# Patient Record
Sex: Male | Born: 1980 | Race: White | Hispanic: No | Marital: Married | Smoking: Former smoker
Health system: Southern US, Community
[De-identification: ages and names within clinical notes are randomized; demographics above are authoritative.]

## PROBLEM LIST (undated history)

## (undated) DIAGNOSIS — F988 Other specified behavioral and emotional disorders with onset usually occurring in childhood and adolescence: Secondary | ICD-10-CM

## (undated) DIAGNOSIS — K219 Gastro-esophageal reflux disease without esophagitis: Secondary | ICD-10-CM

## (undated) HISTORY — DX: Other specified behavioral and emotional disorders with onset usually occurring in childhood and adolescence: F98.8

## (undated) HISTORY — DX: Gastro-esophageal reflux disease without esophagitis: K21.9

---

## 2014-09-21 DIAGNOSIS — R41840 Attention and concentration deficit: Secondary | ICD-10-CM

## 2015-01-29 ENCOUNTER — Encounter: Payer: Self-pay | Admitting: Adult Health

## 2015-01-29 ENCOUNTER — Ambulatory Visit (INDEPENDENT_AMBULATORY_CARE_PROVIDER_SITE_OTHER): Payer: BC Managed Care – PPO | Admitting: Adult Health

## 2015-01-29 VITALS — BP 110/80 | HR 71 | Temp 98.6°F | Ht 69.0 in | Wt 190.1 lb

## 2015-01-29 DIAGNOSIS — Z23 Encounter for immunization: Secondary | ICD-10-CM | POA: Diagnosis not present

## 2015-01-29 DIAGNOSIS — Z7689 Persons encountering health services in other specified circumstances: Secondary | ICD-10-CM

## 2015-01-29 DIAGNOSIS — Z7189 Other specified counseling: Secondary | ICD-10-CM

## 2015-01-29 DIAGNOSIS — J0110 Acute frontal sinusitis, unspecified: Secondary | ICD-10-CM | POA: Diagnosis not present

## 2015-01-29 MED ORDER — DOXYCYCLINE HYCLATE 100 MG PO CAPS
100.0000 mg | ORAL_CAPSULE | Freq: Two times a day (BID) | ORAL | Status: DC
Start: 2015-01-29 — End: 2015-03-01

## 2015-01-29 NOTE — Patient Instructions (Signed)
It was great meeting you today. Please follow up with me in one month for a complete physical - do not eat anything after midnight.   The antibiotics will be sent to the pharmacy.   Let me know if you need anything in the mean time.

## 2015-01-29 NOTE — Progress Notes (Signed)
HPI:  Riley Cardenas is here to establish care. He is a very pleasant, young caucasian male. He does not smoke, is married and has a 81 month old daughter.  Last PCP and physical: 10- years ago.   Immunizations:UTD Diet:Likes sweets, does not eat a lot of vegetables. "I eat healthy 50 % of the time).  Exercise:Does not exercise as much as he should. Occ. Run.  Eye Doctor: yearly  Dentist: Will make appointment.  Has the following chronic problems that require follow up and concerns today:   He has had cough, congestion and sinus pain and pressure with associated fever for about three weeks. Fever was three weeks ago.  This week he endorses having a sore throat. Today he feels better, throat is not as sore, very little sinus pressure.   He has been using netty pot before bed- which he endorses helps with his sinus pressure.     ROS negative for unless reported above:unintentional weight loss, hearing or vision loss, chest pain, palpitations, leg claudication, struggling to breath,Not feeling congested in the chest, no orthopenia, no cough,no wheezing, normal appetite, no soft tissue swelling, no hemoptysis, melena, hematochezia, hematuria, falls, loc, si, or thoughts of self harm.    Past Medical History  Diagnosis Date  . GERD (gastroesophageal reflux disease)     History reviewed. No pertinent past surgical history.  Family History  Problem Relation Age of Onset  . Arthritis Maternal Grandmother   . Hypertension Maternal Grandfather   . Arthritis Maternal Grandfather     History   Social History  . Marital Status: Married    Spouse Name: N/A  . Number of Children: N/A  . Years of Education: N/A   Social History Main Topics  . Smoking status: Former Research scientist (life sciences)  . Smokeless tobacco: Not on file  . Alcohol Use: 0.0 oz/week    0 Standard drinks or equivalent per week     Comment: occ  . Drug Use: No  . Sexual Activity: Not on file   Other Topics Concern  . None    Social History Narrative  . None    No current outpatient prescriptions on file.  EXAM:  Filed Vitals:   01/29/15 0840  BP: 110/80  Pulse: 71  Temp: 98.6 F (37 C)    Body mass index is 28.06 kg/(m^2).  GENERAL: vitals reviewed and listed above, alert, oriented, appears well hydrated and in no acute distress  HEENT: atraumatic, conjunttiva clear, no obvious abnormalities on inspection of external nose and ears  NECK: Neck is soft and supple without masses, no adenopathy or thyromegaly, trachea midline, no JVD. Normal range of motion.   LUNGS: clear to auscultation bilaterally, no wheezes, rales or rhonchi, good air movement  CV: Regular rate and rhythm, normal S1/S2, no audible murmurs, gallops, or rubs. No carotid bruit and no peripheral edema.   MS: moves all extremities without noticeable abnormality. No edema noted  Abd: soft/nontender/nondistended/normal bowel sounds   Skin: warm and dry, no rash   Extremities: No clubbing, cyanosis, or edema. Capillary refill is WNL. Pulses intact bilaterally in upper and lower extremities.   Neuro: CN II-XII intact, sensation and reflexes normal throughout, 5/5 muscle strength in bilateral upper and lower extremities. Normal finger to nose. Normal rapid alternating movements. Normal romberg. No pronator drift.   PSYCH: pleasant and cooperative, no obvious depression or anxiety  ASSESSMENT AND PLAN: 1. Acute frontal sinusitis, recurrence not specified - doxycycline (VIBRAMYCIN) 100 MG capsule; Take 1 capsule (  100 mg total) by mouth 2 (two) times daily.  Dispense: 14 capsule; Refill: 0 - will take if no improvement in 3 days.    2. Encounter to establish care - Follow up in one month for CPE - Follow up sooner if needed   Discussed the following assessment and plan:  No diagnosis found. -We reviewed the PMH, PSH, FH, SH, Meds and Allergies. -We provided refills for any medications we will prescribe as needed. -We  addressed current concerns per orders and patient instructions. -We have asked for records for pertinent exams, studies, vaccines and notes from previous providers. -We have advised patient to follow up per instructions below.   -Patient advised to return or notify a provider immediately if symptoms worsen or persist or new concerns arise.  There are no Patient Instructions on file for this visit.   [employer]

## 2015-02-11 ENCOUNTER — Telehealth: Payer: Self-pay | Admitting: Adult Health

## 2015-02-11 ENCOUNTER — Other Ambulatory Visit: Payer: Self-pay | Admitting: Adult Health

## 2015-02-11 MED ORDER — AMOXICILLIN-POT CLAVULANATE 875-125 MG PO TABS: 1.0000 | ORAL_TABLET | Freq: Two times a day (BID) | ORAL | Status: DC

## 2015-02-11 NOTE — Telephone Encounter (Signed)
Augmentin sent in. Take twice a day for 7 days. If he continues to have sinus infection he needs to come in.

## 2015-02-11 NOTE — Telephone Encounter (Signed)
Pls advise.  

## 2015-02-11 NOTE — Telephone Encounter (Signed)
Called and spoke with pt and pt is aware. [place] there.  Pt will come back next week if needed.

## 2015-02-11 NOTE — Telephone Encounter (Signed)
Patient Name: Riley Cardenas  DOB: 10-10-1980    Initial Comment Caller states he was seen 6/15, rx 7 days of abx for sinus infection. Sx coming back, and [place] the beach.   Nurse Assessment  Nurse: Raphael Gibney, RN, Vanita Ingles Date/Time (Eastern Time): 02/11/2015 11:33:04 AM  Confirm and document reason for call. If symptomatic, describe symptoms. ---Caller states he was seen on 6/17 and he was prescribed antibiotics for sinus infection for 7 days which he has completed. Sinus infection got better. he was prescribed doxycycline. He is congested and coughing at night. Has yellow nasal congestion. No fever. He has sore throat from drainage. He is out of town at [employer].  Has the patient traveled out of the country within the last 30 days? ---No  Does the patient require triage? ---Yes  Related visit to physician within the last 2 weeks? ---Yes  Does the PT have any chronic conditions? (i.e. diabetes, asthma, etc.) ---No     Guidelines    Guideline Title Affirmed Question Affirmed Notes  Sinus Pain or Congestion Lots of coughing    Final Disposition User   See PCP When Office is Open (within 3 days) Raphael Gibney, Therapist, sports, [place]    Comments  Pt is out of town at [employer] and does not want to go to urgent care. He would like antibiotic called in.  Sees Dr. Dorothyann Peng

## 2015-02-11 NOTE — Telephone Encounter (Signed)
Attempted to call pt; vm not set up.  Will call at a later time.

## 2015-03-01 ENCOUNTER — Encounter: Payer: Self-pay | Admitting: Adult Health

## 2015-03-01 ENCOUNTER — Ambulatory Visit (INDEPENDENT_AMBULATORY_CARE_PROVIDER_SITE_OTHER): Payer: BC Managed Care – PPO | Admitting: Adult Health

## 2015-03-01 VITALS — BP 116/76 | Temp 98.0°F | Ht 69.0 in | Wt 187.4 lb

## 2015-03-01 DIAGNOSIS — Z0000 Encounter for general adult medical examination without abnormal findings: Secondary | ICD-10-CM | POA: Diagnosis not present

## 2015-03-01 LAB — LIPID PANEL
Cholesterol: 157 mg/dL (ref 0–200)
HDL: 39.5 mg/dL (ref >39.00)
LDL Cholesterol: 102 mg/dL — ABNORMAL HIGH (ref 0–99)
NONHDL: 117.5
TRIGLYCERIDES: 77 mg/dL (ref 0.0–149.0)
Total CHOL/HDL Ratio: 4
VLDL: 15.4 mg/dL (ref 0.0–40.0)

## 2015-03-01 LAB — CBC WITH DIFFERENTIAL/PLATELET
BASOS ABS: 0 10*3/uL (ref 0.0–0.1)
BASOS PCT: 0.6 % (ref 0.0–3.0)
EOS PCT: 3.4 % (ref 0.0–5.0)
Eosinophils Absolute: 0.2 10*3/uL (ref 0.0–0.7)
HCT: 45.3 % (ref 39.0–52.0)
HEMOGLOBIN: 15.5 g/dL (ref 13.0–17.0)
LYMPHS ABS: 2.2 10*3/uL (ref 0.7–4.0)
Lymphocytes Relative: 31.5 % (ref 12.0–46.0)
MCHC: 34.2 g/dL (ref 30.0–36.0)
MCV: 89.5 fl (ref 78.0–100.0)
Monocytes Absolute: 0.3 10*3/uL (ref 0.1–1.0)
Monocytes Relative: 4.9 % (ref 3.0–12.0)
Neutro Abs: 4.2 10*3/uL (ref 1.4–7.7)
Neutrophils Relative %: 59.6 % (ref 43.0–77.0)
PLATELETS: 200 10*3/uL (ref 150.0–400.0)
RBC: 5.06 Mil/uL (ref 4.22–5.81)
RDW: 14.2 % (ref 11.5–15.5)
WBC: 7 10*3/uL (ref 4.0–10.5)

## 2015-03-01 LAB — POCT URINALYSIS DIPSTICK
BILIRUBIN UA: NEGATIVE
Blood, UA: NEGATIVE
Glucose, UA: NEGATIVE
KETONES UA: NEGATIVE
Leukocytes, UA: NEGATIVE
Nitrite, UA: NEGATIVE
PH UA: 5.5
Protein, UA: NEGATIVE
SPEC GRAV UA: 1.01
Urobilinogen, UA: 0.2

## 2015-03-01 LAB — HEPATIC FUNCTION PANEL
ALT: 17 U/L (ref 0–53)
AST: 18 U/L (ref 0–37)
Albumin: 4.4 g/dL (ref 3.5–5.2)
Alkaline Phosphatase: 58 U/L (ref 39–117)
BILIRUBIN DIRECT: 0.2 mg/dL (ref 0.0–0.3)
TOTAL PROTEIN: 7.2 g/dL (ref 6.0–8.3)
Total Bilirubin: 0.9 mg/dL (ref 0.2–1.2)

## 2015-03-01 LAB — HEMOGLOBIN A1C: HEMOGLOBIN A1C: 5 % (ref 4.6–6.5)

## 2015-03-01 LAB — BASIC METABOLIC PANEL
BUN: 13 mg/dL (ref 6–23)
CO2: 28 meq/L (ref 19–32)
CREATININE: 0.85 mg/dL (ref 0.40–1.50)
Calcium: 9.3 mg/dL (ref 8.4–10.5)
Chloride: 105 mEq/L (ref 96–112)
GFR: 109.01 mL/min (ref >60.00)
GLUCOSE: 83 mg/dL (ref 70–99)
Potassium: 3.9 mEq/L (ref 3.5–5.1)
SODIUM: 138 meq/L (ref 135–145)

## 2015-03-01 LAB — TSH: TSH: 0.95 u[IU]/mL (ref 0.35–4.50)

## 2015-03-01 NOTE — Progress Notes (Signed)
Pre visit review using our clinic review tool, if applicable. No additional management support is needed unless otherwise documented below in the visit note. 

## 2015-03-01 NOTE — Patient Instructions (Signed)
Great seeing you again.   Continue to stay young and healthy! The holidays are over... 100% healthy eating and exercise.   I will follow up with you regarding your blood work.   Come back sooner than 10 years for a physical, preferably next year.   If you need anything in the meantime, please let me know.

## 2015-03-01 NOTE — Progress Notes (Signed)
Subjective:    Patient ID: Riley Cardenas, male    DOB: 17-Jan-1981, 34 y.o.   MRN: [id number]  HPI89 year old healthy male who presents to the office today for his CPE.  has a past medical history of GERD (gastroesophageal reflux disease) and ADD (attention deficit disorder). Has not had a physical in "10 years". Has no recent hospitalizations. Married with one new born boy.   He has no complaints today. Denies fevers, chills, congestion, CP, SOB, muscle aches and pains.   He is exercising and continue to work on eating a healthy diet.    Review of Systems  Constitutional: Negative.   HENT: Negative.   Eyes: Negative.   Respiratory: Negative.   Cardiovascular: Negative.   Gastrointestinal: Negative.   Endocrine: Negative.   Genitourinary: Negative.   Musculoskeletal: Negative.   Skin: Negative.   Allergic/Immunologic: Negative.   Neurological: Negative.   Hematological: Negative.   Psychiatric/Behavioral: Negative.   All other systems reviewed and are negative.  Past Medical History  Diagnosis Date  . GERD (gastroesophageal reflux disease)   . ADD (attention deficit disorder)     History   Social History  . Marital Status: Married    Spouse Name: N/A  . Number of Children: N/A  . Years of Education: N/A   Occupational History  . Not on file.   Social History Main Topics  . Smoking status: Former Research scientist (life sciences)  . Smokeless tobacco: Not on file  . Alcohol Use: 0.0 oz/week    0 Standard drinks or equivalent per week     Comment: occ  . Drug Use: No  . Sexual Activity: Not on file   Other Topics Concern  . 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  Family History  Problem Relation Age of Onset  . Arthritis Maternal Grandmother   . Hypertension Maternal Grandfather   . Arthritis Maternal Grandfather     No Known Allergies  No current  outpatient prescriptions on file prior to visit.   No current facility-administered medications on file prior to visit.    BP 116/76 mmHg  Temp(Src) 98 F (36.7 C) (Oral)  Ht 5\' 9"  (1.753 m)  Wt 187 lb 6.4 oz (85.004 kg)  BMI 27.66 kg/m2       Objective:   Physical Exam  Constitutional: He is oriented to person, place, and time. He appears well-developed and well-nourished. No distress.  HENT:  Head: Normocephalic and atraumatic.  Right Ear: External ear normal.  Left Ear: External ear normal.  Nose: Nose normal.  Mouth/Throat: Oropharynx is clear and moist. No oropharyngeal exudate.  Eyes: Conjunctivae and EOM are normal. Pupils are equal, round, and reactive to light. Right eye exhibits no discharge. Left eye exhibits no discharge.  Wearing glasses  Neck: Normal range of motion. Neck supple. No JVD present. No tracheal deviation present.  Cardiovascular: Normal rate, regular rhythm, normal heart sounds and intact distal pulses.  Exam reveals no gallop and no friction rub.   No murmur heard. Pulmonary/Chest: Effort normal and breath sounds normal. No respiratory distress. He has no wheezes. He has no rales. He exhibits no tenderness.  Abdominal: Soft. Bowel sounds are normal. He exhibits no distension and no mass. There is no tenderness. There is no rebound and no guarding.  Genitourinary:  Deferred  Musculoskeletal: Normal range of motion. He exhibits no edema or tenderness.  Lymphadenopathy:    He has no cervical adenopathy.  Neurological: He is alert and oriented to person, place, and time. He has normal reflexes.  Skin: Skin is warm and dry. No rash noted. No erythema. No pallor.  Scattered moles. Nothing concerning  Psychiatric: He has a normal mood and affect. His behavior is normal. Judgment and thought content normal.  Nursing note and vitals reviewed.      Assessment & Plan:  1. Routine general medical examination at a health care facility - Basic metabolic  panel - CBC with Differential/Platelet - Hemoglobin A1c - Hepatic function panel - Lipid panel - POCT urinalysis dipstick - TSH - HIV antibody -Follow up in one year for CPE - Follow up sooner if needed.  - Continue to exercise and eat a heart healthy diet.

## 2015-03-02 LAB — HIV ANTIBODY (ROUTINE TESTING W REFLEX): HIV: NONREACTIVE

## 2015-11-30 ENCOUNTER — Telehealth: Payer: BC Managed Care – PPO | Admitting: Nurse Practitioner

## 2015-11-30 DIAGNOSIS — J0101 Acute recurrent maxillary sinusitis: Secondary | ICD-10-CM

## 2015-11-30 MED ORDER — AZITHROMYCIN 250 MG PO TABS: ORAL_TABLET | ORAL | Status: DC

## 2015-11-30 NOTE — Progress Notes (Signed)

## 2016-03-26 ENCOUNTER — Ambulatory Visit (INDEPENDENT_AMBULATORY_CARE_PROVIDER_SITE_OTHER): Payer: BC Managed Care – PPO | Admitting: Adult Health

## 2016-03-26 ENCOUNTER — Encounter: Payer: Self-pay | Admitting: Adult Health

## 2016-03-26 VITALS — BP 120/78 | HR 82 | Temp 97.6°F | Ht 69.0 in | Wt 182.8 lb

## 2016-03-26 DIAGNOSIS — J069 Acute upper respiratory infection, unspecified: Secondary | ICD-10-CM | POA: Diagnosis not present

## 2016-03-26 MED ORDER — METHYLPREDNISOLONE 4 MG PO TBPK: ORAL_TABLET | ORAL | 0 refills | Status: DC

## 2016-03-26 NOTE — Progress Notes (Signed)
Subjective:    Patient ID: Riley Cardenas, male    DOB: 12/25/1980, 35 y.o.   MRN: [id number]  URI   This is a new problem. The current episode started in the past 7 days. The problem has been gradually improving. There has been no fever. Associated symptoms include congestion, coughing, rhinorrhea and sinus pain. Pertinent negatives include no abdominal pain, ear pain, neck pain, rash or wheezing. He has tried antihistamine and decongestant for the symptoms. The treatment provided mild relief.    He reports that all of his symptoms have been improving except for his cough. The cough is non productive and is constant. He feels as though he is congested in his upper chest  Review of Systems  HENT: Positive for congestion and rhinorrhea. Negative for ear pain.   Respiratory: Positive for cough and chest tightness. Negative for wheezing.   Cardiovascular: Negative.   Gastrointestinal: Negative for abdominal pain.  Musculoskeletal: Negative for neck pain.  Skin: Negative for rash.   Past Medical History:  Diagnosis Date  . ADD (attention deficit disorder)   . GERD (gastroesophageal reflux disease)     Social History   Social History  . Marital status: Married    Spouse name: N/A  . Number of children: N/A  . Years of education: N/A   Occupational History  . Not on file.   Social History Main Topics  . Smoking status: Former Research scientist (life sciences)  . Smokeless tobacco: Not on file  . Alcohol use 0.0 oz/week     Comment: occ  . Drug use: No  . Sexual activity: Not on file   Other Topics Concern  . 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  Family History  Problem Relation Age of Onset  . Arthritis Maternal Grandmother   . Hypertension Maternal Grandfather   . Arthritis Maternal Grandfather     No Known Allergies  Current Outpatient Prescriptions on File  Prior to Visit  Medication Sig Dispense Refill  . azithromycin (ZITHROMAX Z-PAK) 250 MG tablet As directed 1 each 0   No current facility-administered medications on file prior to visit.     BP 120/78 (BP Location: Left Arm, Patient Position: Sitting, Cuff Size: Normal)   Pulse 82   Temp 97.6 F (36.4 C) (Oral)   Ht 5\' 9"  (1.753 m)   Wt 182 lb 12.8 oz (82.9 kg)   BMI 26.99 kg/m       Objective:   Physical Exam  Constitutional: He is oriented to person, place, and time. He appears well-developed and well-nourished.  HENT:  Head: Normocephalic and atraumatic.  Right Ear: Hearing, tympanic membrane, external ear and ear canal normal.  Left Ear: Hearing, tympanic membrane, external ear and ear canal normal.  Nose: Mucosal edema and rhinorrhea present. Right sinus exhibits no maxillary sinus tenderness and no frontal sinus tenderness. Left sinus exhibits no maxillary sinus tenderness and no frontal sinus tenderness.  Mouth/Throat: Uvula is midline, oropharynx is clear and moist and mucous membranes are normal. No oropharyngeal exudate, posterior oropharyngeal edema, posterior oropharyngeal erythema or tonsillar abscesses.  Eyes: Conjunctivae and EOM are normal. Pupils are equal, round, and reactive to light. Right eye exhibits no discharge. No scleral icterus.  Neck: Normal range of motion. Neck supple.  Pulmonary/Chest: Effort normal. No respiratory  distress. He has wheezes in the right upper field, the right middle field, the right lower field, the left upper field, the left middle field and the left lower field. He has no rhonchi. He has no rales. He exhibits no tenderness.  Lymphadenopathy:    He has no cervical adenopathy.  Neurological: He is alert and oriented to person, place, and time.  Skin: Skin is warm and dry. No rash noted. He is not diaphoretic. No erythema. No pallor.  Psychiatric: He has a normal mood and affect. His behavior is normal. Judgment and thought content  normal.  Nursing note and vitals reviewed.     Assessment & Plan:  1. Acute upper respiratory infection - Likely viral  - Not concerned for pneumonia.  - Will treat likely bronchitis with prednisone dose pack - methylPREDNISolone (MEDROL DOSEPAK) 4 MG TBPK tablet; Take as directed  Dispense: 21 tablet; Refill: 0 - Follow up if no improvement - Continue to use Mucinex as needed  Dorothyann Peng, NP

## 2016-03-26 NOTE — Progress Notes (Signed)
Pre visit review using our clinic review tool, if applicable. No additional management support is needed unless otherwise documented below in the visit note. 

## 2016-04-01 ENCOUNTER — Ambulatory Visit (INDEPENDENT_AMBULATORY_CARE_PROVIDER_SITE_OTHER): Payer: BC Managed Care – PPO | Admitting: Adult Health

## 2016-04-01 ENCOUNTER — Encounter: Payer: Self-pay | Admitting: Adult Health

## 2016-04-01 VITALS — BP 122/64 | Temp 98.1°F | Ht 69.0 in | Wt 176.4 lb

## 2016-04-01 DIAGNOSIS — J209 Acute bronchitis, unspecified: Secondary | ICD-10-CM

## 2016-04-01 MED ORDER — ALBUTEROL SULFATE HFA 108 (90 BASE) MCG/ACT IN AERS: 2.0000 | INHALATION_SPRAY | Freq: Four times a day (QID) | RESPIRATORY_TRACT | 2 refills | Status: DC | PRN

## 2016-04-01 MED ORDER — DOXYCYCLINE HYCLATE 100 MG PO CAPS: 100.0000 mg | ORAL_CAPSULE | Freq: Two times a day (BID) | ORAL | 0 refills | Status: DC

## 2016-04-01 MED ORDER — IPRATROPIUM-ALBUTEROL 0.5-2.5 (3) MG/3ML IN SOLN: 3.0000 mL | Freq: Once | RESPIRATORY_TRACT | Status: DC

## 2016-04-01 MED ORDER — PREDNISONE 10 MG PO TABS: ORAL_TABLET | ORAL | 0 refills | Status: DC

## 2016-04-01 NOTE — Patient Instructions (Signed)
I have sent in a stronger dose of Prednisone for you. Take as directed  40 mg x 3 days  20 mg x 3 days 10 mg x 3 days  I have also sent in an albuterol inhaler. Use this as needed if you feel yourself getting wheezy.   Also use a humidifier when you are home.

## 2016-04-01 NOTE — Progress Notes (Signed)
Subjective:    Patient ID: Xyler Metze, male    DOB: 05-05-1981, 35 y.o.   MRN: [id number]  HPI  35 year old male who presents to the office for follow up from a visit last week for perceived URI. He was treated with a prednisone dose pack. He returns because he feels as though he has not improved. He continues to wheeze and have a semi productive cough. He is only sleeping an hour at a time.   He finished his medrol dose pack and feels as though did not make a difference.   Denies any fevers or feeling acutely ill   Review of Systems  Constitutional: Positive for fatigue.  HENT: Negative.   Respiratory: Positive for cough, chest tightness, shortness of breath and wheezing.   Cardiovascular: Negative.   Skin: Negative.   Neurological: Negative.   All other systems reviewed and are negative.  Past Medical History:  Diagnosis Date  . ADD (attention deficit disorder)   . GERD (gastroesophageal reflux disease)     Social History   Social History  . Marital status: Married    Spouse name: N/A  . Number of children: N/A  . Years of education: N/A   Occupational History  . Not on file.   Social History Main Topics  . Smoking status: Former Research scientist (life sciences)  . Smokeless tobacco: Not on file  . Alcohol use 0.0 oz/week     Comment: occ  . Drug use: No  . Sexual activity: Not on file   Other Topics Concern  . 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  Family History  Problem Relation Age of Onset  . Arthritis Maternal Grandmother   . Hypertension Maternal Grandfather   . Arthritis Maternal Grandfather     No Known Allergies  Current Outpatient Prescriptions on File Prior to Visit  Medication Sig Dispense Refill  . amphetamine-dextroamphetamine (ADDERALL XR) 30 MG 24 hr capsule     . azithromycin (ZITHROMAX Z-PAK) 250 MG tablet As directed 1 each 0   . methylPREDNISolone (MEDROL DOSEPAK) 4 MG TBPK tablet Take as directed 21 tablet 0   No current facility-administered medications on file prior to visit.     BP 122/64   Temp 98.1 F (36.7 C) (Oral)   Ht 5\' 9"  (1.753 m)   Wt 176 lb 6.4 oz (80 kg)   BMI 26.05 kg/m       Objective:   Physical Exam  Constitutional: He is oriented to person, place, and time. He appears well-developed and well-nourished. No distress.  Cardiovascular: Normal rate, regular rhythm, normal heart sounds and intact distal pulses.  Exam reveals no friction rub.   No murmur heard. Pulmonary/Chest: Effort normal. No respiratory distress. He has wheezes in the right upper field, the right middle field, the right lower field, the left upper field, the left middle field and the left lower field. He has no rhonchi. He exhibits no tenderness.  Musculoskeletal: Normal range of motion. He exhibits deformity. He exhibits no edema or tenderness.  Neurological: He is alert and oriented to person, place, and time.  Skin: Skin is warm and dry. No rash noted. He is not diaphoretic. No erythema. No pallor.  Psychiatric: He has a normal mood and affect. His behavior is normal. Judgment and  thought content normal.  Vitals reviewed.     Assessment & Plan:  1. Acute bronchitis, unspecified organism - predniSONE (DELTASONE) 10 MG tablet; 40 mg x 3 days, 20 mg x 3 days , 10 mg x 3 days  Dispense: 21 tablet; Refill: 0 - albuterol (PROVENTIL HFA;VENTOLIN HFA) 108 (90 Base) MCG/ACT inhaler; Inhale 2 puffs into the lungs every 6 (six) hours as needed for wheezing or shortness of breath.  Dispense: 1 Inhaler; Refill: 2 - Script given for Doxycycline to take if he is not feeling any better in the next 4-5 days - Follow up as needed - Breathing treatment given. Patient endorsed being able to breath easier and did not feel wheezy after the breathing treatment. Exam revealed no wheezes in bilateral lungs after breathing treatment -  ipratropium-albuterol (DUONEB) 0.5-2.5 (3) MG/3ML nebulizer solution 3 mL; Take 3 mLs by nebulization once.   Dorothyann Peng, NP

## 2016-07-23 ENCOUNTER — Ambulatory Visit (INDEPENDENT_AMBULATORY_CARE_PROVIDER_SITE_OTHER): Payer: BC Managed Care – PPO | Admitting: Adult Health

## 2016-07-23 ENCOUNTER — Encounter: Payer: Self-pay | Admitting: Adult Health

## 2016-07-23 VITALS — BP 106/72 | Temp 98.8°F | Ht 69.0 in | Wt 179.4 lb

## 2016-07-23 DIAGNOSIS — Z0001 Encounter for general adult medical examination with abnormal findings: Secondary | ICD-10-CM

## 2016-07-23 DIAGNOSIS — Z760 Encounter for issue of repeat prescription: Secondary | ICD-10-CM | POA: Diagnosis not present

## 2016-07-23 DIAGNOSIS — J209 Acute bronchitis, unspecified: Secondary | ICD-10-CM

## 2016-07-23 DIAGNOSIS — Z0000 Encounter for general adult medical examination without abnormal findings: Secondary | ICD-10-CM

## 2016-07-23 LAB — LIPID PANEL
CHOLESTEROL: 134 mg/dL (ref 0–200)
HDL: 36.5 mg/dL — AB (ref >39.00)
LDL CALC: 79 mg/dL (ref 0–99)
NonHDL: 97.64
TRIGLYCERIDES: 95 mg/dL (ref 0.0–149.0)
Total CHOL/HDL Ratio: 4
VLDL: 19 mg/dL (ref 0.0–40.0)

## 2016-07-23 LAB — HEPATIC FUNCTION PANEL
ALBUMIN: 4.4 g/dL (ref 3.5–5.2)
ALK PHOS: 64 U/L (ref 39–117)
ALT: 16 U/L (ref 0–53)
AST: 16 U/L (ref 0–37)
BILIRUBIN DIRECT: 0.2 mg/dL (ref 0.0–0.3)
BILIRUBIN TOTAL: 0.8 mg/dL (ref 0.2–1.2)
Total Protein: 7.1 g/dL (ref 6.0–8.3)

## 2016-07-23 LAB — CBC WITH DIFFERENTIAL/PLATELET
BASOS ABS: 0 10*3/uL (ref 0.0–0.1)
Basophils Relative: 0.7 % (ref 0.0–3.0)
EOS ABS: 0.5 10*3/uL (ref 0.0–0.7)
Eosinophils Relative: 8.2 % — ABNORMAL HIGH (ref 0.0–5.0)
HCT: 45.4 % (ref 39.0–52.0)
Hemoglobin: 15.7 g/dL (ref 13.0–17.0)
LYMPHS ABS: 2.6 10*3/uL (ref 0.7–4.0)
LYMPHS PCT: 45 % (ref 12.0–46.0)
MCHC: 34.7 g/dL (ref 30.0–36.0)
MCV: 90.1 fl (ref 78.0–100.0)
MONOS PCT: 9 % (ref 3.0–12.0)
Monocytes Absolute: 0.5 10*3/uL (ref 0.1–1.0)
NEUTROS PCT: 37.1 % — AB (ref 43.0–77.0)
Neutro Abs: 2.1 10*3/uL (ref 1.4–7.7)
PLATELETS: 218 10*3/uL (ref 150.0–400.0)
RBC: 5.03 Mil/uL (ref 4.22–5.81)
RDW: 13.1 % (ref 11.5–15.5)
WBC: 5.7 10*3/uL (ref 4.0–10.5)

## 2016-07-23 LAB — POC URINALSYSI DIPSTICK (AUTOMATED)
BILIRUBIN UA: NEGATIVE
Blood, UA: NEGATIVE
Glucose, UA: NEGATIVE
KETONES UA: NEGATIVE
LEUKOCYTES UA: NEGATIVE
Nitrite, UA: NEGATIVE
PROTEIN UA: NEGATIVE
Spec Grav, UA: 1.01
Urobilinogen, UA: 0.2
pH, UA: 6.5

## 2016-07-23 LAB — BASIC METABOLIC PANEL
BUN: 9 mg/dL (ref 6–23)
CALCIUM: 9.7 mg/dL (ref 8.4–10.5)
CO2: 30 mEq/L (ref 19–32)
CREATININE: 0.9 mg/dL (ref 0.40–1.50)
Chloride: 103 mEq/L (ref 96–112)
GFR: 101.25 mL/min (ref >60.00)
GLUCOSE: 85 mg/dL (ref 70–99)
Potassium: 4.2 mEq/L (ref 3.5–5.1)
SODIUM: 140 meq/L (ref 135–145)

## 2016-07-23 LAB — TSH: TSH: 1.14 u[IU]/mL (ref 0.35–4.50)

## 2016-07-23 MED ORDER — FLUTICASONE PROPIONATE 50 MCG/ACT NA SUSP: 2.0000 | Freq: Every day | NASAL | 6 refills | Status: DC

## 2016-07-23 MED ORDER — ALBUTEROL SULFATE HFA 108 (90 BASE) MCG/ACT IN AERS: 2.0000 | INHALATION_SPRAY | Freq: Four times a day (QID) | RESPIRATORY_TRACT | 2 refills | Status: DC | PRN

## 2016-07-23 MED ORDER — PREDNISONE 10 MG PO TABS: ORAL_TABLET | ORAL | 1 refills | Status: DC

## 2016-07-23 NOTE — Progress Notes (Signed)
Subjective:    Patient ID: Riley Cardenas, male    DOB: March 08, 1981, 35 y.o.   MRN: [id number]  HPI  Patient presents for yearly preventative medicine examination. He is a healthy 35 year old male who  has a past medical history of ADD (attention deficit disorder) and GERD (gastroesophageal reflux disease).  All immunizations and health maintenance protocols were reviewed with the patient and needed orders were placed.  Appropriate screening laboratory values were ordered for the patient including screening of hyperlipidemia, renal function and hepatic function. If indicated by BPH, a PSA was ordered.  Medication reconciliation,  past medical history, social history, problem list and allergies were reviewed in detail with the patient  Goals were established with regard to weight loss, exercise, and  diet in compliance with medications  He needs a prescription for Flonase for seasonal allergies as well as a refill of Albuterol   He is also complaining of a non productive cough he has had for 2 weeks. He reports it is the same cough that he gets every year in the winter time. Denies any fevers, n/v/d or sinus pain and pressure.   He denies any interval history   Review of Systems  Constitutional: Negative.   HENT: Negative.   Eyes: Negative.   Respiratory: Positive for cough. Negative for shortness of breath and wheezing.   Cardiovascular: Negative.   Gastrointestinal: Negative.   Endocrine: Negative.   Genitourinary: Negative.   Musculoskeletal: Negative.   Skin: Negative.   Allergic/Immunologic: Negative.   Neurological: Negative.   Hematological: Negative.   Psychiatric/Behavioral: Negative.    Past Medical History:  Diagnosis Date  . ADD (attention deficit disorder)   . GERD (gastroesophageal reflux disease)     Social History   Social History  . Marital status: Married    Spouse name: N/A  . Number of children: N/A  . Years of education: N/A   Occupational History   . Not on file.   Social History Main Topics  . Smoking status: Former Research scientist (life sciences)  . Smokeless tobacco: Not on file  . Alcohol use 0.0 oz/week     Comment: occ  . Drug use: No  . Sexual activity: Not on file   Other Topics Concern  . 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    Married    3 month old son        No past surgical history on file.  Family History  Problem Relation Age of Onset  . Arthritis Maternal Grandmother   . Hypertension Maternal Grandfather   . Arthritis Maternal Grandfather     No Known Allergies  Current Outpatient Prescriptions on File Prior to Visit  Medication Sig Dispense Refill  . albuterol (PROVENTIL HFA;VENTOLIN HFA) 108 (90 Base) MCG/ACT inhaler Inhale 2 puffs into the lungs every 6 (six) hours as needed for wheezing or shortness of breath. 1 Inhaler 2  . amphetamine-dextroamphetamine (ADDERALL XR) 30 MG 24 hr capsule      No current facility-administered medications on file prior to visit.     BP 106/72   Temp 98.8 F (37.1 C) (Oral)   Ht 5\' 9"  (1.753 m)   Wt 179 lb 6.4 oz (81.4 kg)   BMI 26.49 kg/m       Objective:   Physical Exam  Constitutional: He is oriented to person, place, and time. He appears well-developed and well-nourished. No distress.  HENT:  Head:  Normocephalic and atraumatic.  Right Ear: External ear normal.  Left Ear: External ear normal.  Nose: Nose normal.  Mouth/Throat: Oropharynx is clear and moist. No oropharyngeal exudate.  Eyes: Conjunctivae and EOM are normal. Pupils are equal, round, and reactive to light. Right eye exhibits no discharge. Left eye exhibits no discharge. No scleral icterus.  Neck: Normal range of motion. Neck supple. No JVD present. No tracheal deviation present. No thyromegaly present.  Cardiovascular: Normal rate, regular rhythm, normal heart sounds and intact distal pulses.  Exam reveals no gallop and no friction rub.   No murmur  heard. Pulmonary/Chest: Effort normal. No stridor. No respiratory distress. He has wheezes (trace wheezing throughout). He has no rales. He exhibits no tenderness.  Non productive cough   Abdominal: Soft. Bowel sounds are normal. He exhibits no distension and no mass. There is no tenderness. There is no rebound and no guarding.  Genitourinary:  Genitourinary Comments: Deferred  Musculoskeletal: Normal range of motion. He exhibits no edema, tenderness or deformity.  Lymphadenopathy:    He has no cervical adenopathy.  Neurological: He is alert and oriented to person, place, and time. He has normal reflexes. He displays normal reflexes. No cranial nerve deficit. Coordination normal.  Skin: Skin is warm and dry. No rash noted. He is not diaphoretic. No erythema. No pallor.  Psychiatric: He has a normal mood and affect. His behavior is normal. Judgment and thought content normal.  Nursing note and vitals reviewed.     Assessment & Plan:  1. Routine general medical examination at a health care facility - Encouraged a heart healthy diet and frequent exercise - Basic metabolic panel - CBC with Differential/Platelet - Hepatic function panel - Lipid panel - TSH - POCT Urinalysis Dipstick (Automated) - Follow up in one year or sooner if needed  2. Acute bronchitis, unspecified organism  - albuterol (PROVENTIL HFA;VENTOLIN HFA) 108 (90 Base) MCG/ACT inhaler; Inhale 2 puffs into the lungs every 6 (six) hours as needed for wheezing or shortness of breath.  Dispense: 1 Inhaler; Refill: 2 - predniSONE (DELTASONE) 10 MG tablet; 40 mg x 3 days, 20 mg x 3 days , 10 mg x 3 days  Dispense: 21 tablet; Refill: 1  3. Medication refill  - fluticasone (FLONASE) 50 MCG/ACT nasal spray; Place 2 sprays into both nostrils daily.  Dispense: 16 g; Refill: 6 - albuterol (PROVENTIL HFA;VENTOLIN HFA) 108 (90 Base) MCG/ACT inhaler; Inhale 2 puffs into the lungs every 6 (six) hours as needed for wheezing or shortness  of breath.  Dispense: 1 Inhaler; Refill: 2  Dorothyann Peng, NP

## 2017-04-13 ENCOUNTER — Encounter: Payer: Self-pay | Admitting: Adult Health

## 2017-05-07 ENCOUNTER — Encounter: Payer: Self-pay | Admitting: Adult Health

## 2017-10-18 ENCOUNTER — Ambulatory Visit: Payer: BC Managed Care – PPO | Admitting: Family

## 2017-10-18 ENCOUNTER — Encounter: Payer: Self-pay | Admitting: Family

## 2017-10-18 ENCOUNTER — Ambulatory Visit: Payer: Self-pay

## 2017-10-18 VITALS — BP 126/77 | HR 84 | Temp 98.5°F | Resp 16 | Ht 69.0 in | Wt 192.0 lb

## 2017-10-18 DIAGNOSIS — R221 Localized swelling, mass and lump, neck: Secondary | ICD-10-CM

## 2017-10-18 DIAGNOSIS — K1120 Sialoadenitis, unspecified: Secondary | ICD-10-CM

## 2017-10-18 MED ORDER — AMOXICILLIN-POT CLAVULANATE 875-125 MG PO TABS: 1.0000 | ORAL_TABLET | Freq: Two times a day (BID) | ORAL | 0 refills | Status: DC

## 2017-10-18 NOTE — Patient Instructions (Signed)
Begin augmentin for possible parotid gland infection. Suck on sour candies as you are able for the next few days. Call if increased pain/swelling or if you develop fever.

## 2017-10-18 NOTE — Telephone Encounter (Signed)
Pt. Reports had been sick with sore throat and sinus symptoms last week. Reports both of his children have been sick as well. Noticed swollen, tender lymph node on right side of neck yesterday morning. Request appointment today if possible.   Reason for Disposition . [1] Single large node AND [2] size > 1 inch (2.5 cm) AND [3] no fever  Answer Assessment - Initial Assessment Questions 1. LOCATION: "Where is the swollen node located?" "Is the matching node on the other side of the body also swollen?"      Right side of neck 2. SIZE: "How big is the node?" (Inches or centimeters) (or compare to common objects such as pea, bean, marble, golf ball)      Gum ball 3. ONSET: "When did the swelling start?"      Sunday morning 4. NECK NODES: "Is there a sore throat, runny nose or other symptoms of a cold?"      Yes- sore throat and sinus symptom 5. GROIN OR ARMPIT NODES: "Is there a sore, scratch, cut or painful red area on that arm or leg?"      No 6. FEVER: "Do you have a fever?" If so, ask: "What is it, how was it measured, and when did it start?"      No 7. CAUSE: "What do you think is causing the swollen lymph nodes?"     His children have been sick 8. OTHER SYMPTOMS: "Do you have any other symptoms?"     No 9. PREGNANCY: "Is there any chance you are pregnant?" "When was your last menstrual period?"     No  Protocols used: [place]

## 2017-10-18 NOTE — Telephone Encounter (Signed)
FYI. Appt w/ Melissa.

## 2017-10-18 NOTE — Progress Notes (Signed)
Subjective:    Patient ID: Riley Cardenas, male    DOB: Feb 06, 1981, 37 y.o.   MRN: [id number]  HPI  Riley Cardenas is a 37 yr old male who presents today with chief complain to lymphadenopathy.  Had some cold symptoms last week.  Has not been sleeping well since his kids were sick with the flu.  Yesterday he noted some tenderness when drinking coffee.  Denies fever, ear pain or sore throat.     Review of Systems See HPI  Past Medical History:  Diagnosis Date  . ADD (attention deficit disorder)   . GERD (gastroesophageal reflux disease)      Social History   Socioeconomic History  . Marital status: Married    Spouse name: Not on file  . Number of children: Not on file  . Years of education: Not on file  . Highest education level: Not on file  Social Needs  . Financial resource strain: Not on file  . Food insecurity - worry: Not on file  . Food insecurity - inability: Not on file  . Transportation needs - medical: Not on file  . Transportation needs - non-medical: Not on file  Occupational History  . Not on file  Tobacco Use  . Smoking status: Former Research scientist (life sciences)  . Smokeless tobacco: Never Used  Substance and Sexual Activity  . Alcohol use: Yes    Alcohol/week: 0.0 oz    Comment: occ  . Drug use: No  . Sexual activity: Yes    Partners: Female  Other Topics Concern  . 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  Family History  Problem Relation Age of Onset  . Arthritis Maternal Grandmother   . Hypertension Maternal Grandfather   . Arthritis Maternal Grandfather     No Known Allergies  Current Outpatient Medications on File Prior to Visit  Medication Sig Dispense Refill  . albuterol (PROVENTIL HFA;VENTOLIN HFA) 108 (90 Base) MCG/ACT inhaler Inhale 2 puffs into the lungs every 6 (six) hours as needed for wheezing or shortness of breath. 1 Inhaler 2  .  amphetamine-dextroamphetamine (ADDERALL XR) 30 MG 24 hr capsule     . fluticasone (FLONASE) 50 MCG/ACT nasal spray Place 2 sprays into both nostrils daily. 16 g 6  . predniSONE (DELTASONE) 10 MG tablet 40 mg x 3 days, 20 mg x 3 days , 10 mg x 3 days 21 tablet 1   No current facility-administered medications on file prior to visit.     BP 126/77 (BP Location: Right Arm, Patient Position: Sitting, Cuff Size: Small)   Pulse 84   Temp 98.5 F (36.9 C) (Oral)   Resp 16   Ht 5\' 9"  (1.753 m)   Wt 192 lb (87.1 kg)   SpO2 99%   BMI 28.35 kg/m       Objective:   Physical Exam  Constitutional: He is oriented to person, place, and time. He appears well-developed and well-nourished. No distress.  HENT:  Head: Normocephalic and atraumatic.  Neck:  + tender mass beneath chin on right  Cardiovascular: Normal rate and regular rhythm.  No murmur heard. Pulmonary/Chest: Effort normal and breath sounds normal. No respiratory distress. He has no wheezes. He has no rales.  Musculoskeletal: He exhibits no edema.  Neurological: He is alert and oriented to person, place, and  time.  Skin: Skin is warm and dry.  Psychiatric: He has a normal mood and affect. His behavior is normal. Thought content normal.          Assessment & Plan:  Neck mass- suspect parotitis- advised pt to begin augmentin, sour candies-  Call if increased pain/swelling or if you develop fever. Follow up with pcp in 2 weeks to ensure resolution.

## 2017-10-18 NOTE — Telephone Encounter (Signed)
Noted  

## 2017-11-02 ENCOUNTER — Ambulatory Visit: Payer: BC Managed Care – PPO | Admitting: Adult Health

## 2017-11-02 ENCOUNTER — Encounter: Payer: Self-pay | Admitting: Adult Health

## 2017-11-02 VITALS — BP 110/80 | Temp 98.2°F | Wt 188.0 lb

## 2017-11-02 DIAGNOSIS — Z3009 Encounter for other general counseling and advice on contraception: Secondary | ICD-10-CM

## 2017-11-02 DIAGNOSIS — K1120 Sialoadenitis, unspecified: Secondary | ICD-10-CM | POA: Diagnosis not present

## 2017-11-02 MED ORDER — AMOXICILLIN-POT CLAVULANATE 875-125 MG PO TABS: 1.0000 | ORAL_TABLET | Freq: Two times a day (BID) | ORAL | 0 refills | Status: AC

## 2017-11-02 MED ORDER — OMEPRAZOLE 20 MG PO CPDR: 20.0000 mg | DELAYED_RELEASE_CAPSULE | Freq: Every day | ORAL | 3 refills | Status: DC

## 2017-11-02 NOTE — Progress Notes (Signed)
Subjective:    Patient ID: Riley Cardenas, male    DOB: [birth year], 37 y.o.   MRN: [id number]  HPI  37 year old male who  has a past medical history of ADD (attention deficit disorder) and GERD (gastroesophageal reflux disease). He presents to the office today for two week follow up regarding Parotitis. He was seen by another primary care provider and prescribed a 10 day course of Augmentin. Today in the office he reports that he has noticed a significant decrease in the size of the infected gland. He continues to have some swelling but nothing like it was two weeks ago. Denies any fevers or feeling ill.   He would also like a referral to Urology for a vasectomy.  Review of Systems See HPI   Past Medical History:  Diagnosis Date  . ADD (attention deficit disorder)   . GERD (gastroesophageal reflux disease)     Social History   Socioeconomic History  . Marital status: Married    Spouse name: Not on file  . Number of children: Not on file  . Years of education: Not on file  . Highest education level: Not on file  Social Needs  . Financial resource strain: Not on file  . Food insecurity - worry: Not on file  . Food insecurity - inability: Not on file  . Transportation needs - medical: Not on file  . Transportation needs - non-medical: Not on file  Occupational History  . Not on file  Tobacco Use  . Smoking status: Former Research scientist (life sciences)  . Smokeless tobacco: Never Used  Substance and Sexual Activity  . Alcohol use: Yes    Alcohol/week: 0.0 oz    Comment: occ  . Drug use: No  . Sexual activity: Yes    Partners: Female  Other Topics Concern  . 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 No pertinent surgical history.  Family History  Problem Relation Age of Onset  . Arthritis Maternal Grandmother   . Hypertension Maternal Grandfather   . Arthritis Maternal Grandfather      No Known Allergies  Current Outpatient Medications on File Prior to Visit  Medication Sig Dispense Refill  . albuterol (PROVENTIL HFA;VENTOLIN HFA) 108 (90 Base) MCG/ACT inhaler Inhale 2 puffs into the lungs every 6 (six) hours as needed for wheezing or shortness of breath. 1 Inhaler 2  . amphetamine-dextroamphetamine (ADDERALL XR) 30 MG 24 hr capsule     . fluticasone (FLONASE) 50 MCG/ACT nasal spray Place 2 sprays into both nostrils daily. 16 g 6   No current facility-administered medications on file prior to visit.     BP 110/80 (BP Location: Left Arm)   Temp 98.2 F (36.8 C) (Oral)   Wt 188 lb (85.3 kg)   BMI 27.76 kg/m       Objective:   Physical Exam  Constitutional: He is oriented to person, place, and time. He appears well-developed and well-nourished. No distress.  HENT:  Swollen node below chin on right    Cardiovascular: Normal rate, regular rhythm, normal heart sounds and intact distal pulses. Exam reveals no gallop and no friction rub.  No murmur heard. Pulmonary/Chest: Effort normal and breath sounds normal. No respiratory distress. He has no wheezes. He has no rales. He exhibits no tenderness.  Musculoskeletal: Normal range of motion. He exhibits no edema,  tenderness or deformity.  Neurological: He is alert and oriented to person, place, and time.  Skin: Skin is warm and dry. No rash noted. He is not diaphoretic. No erythema. No pallor.  Psychiatric: He has a normal mood and affect. His behavior is normal. Judgment and thought content normal.  Nursing note and vitals reviewed.     Assessment & Plan:  1. Parotiditis - Will treat with an additional 7 days of augmentin. If not completely resolved then consider imaging and/or referral to ENT  - amoxicillin-clavulanate (AUGMENTIN) 875-125 MG tablet; Take 1 tablet by mouth 2 (two) times daily for 7 days.  Dispense: 14 tablet; Refill: 0  2. Vasectomy evaluation  - Ambulatory referral to Urology   Dorothyann Peng, NP

## 2017-11-08 ENCOUNTER — Encounter: Payer: Self-pay | Admitting: Adult Health

## 2018-01-23 ENCOUNTER — Other Ambulatory Visit: Payer: Self-pay | Admitting: Adult Health

## 2018-01-25 NOTE — Telephone Encounter (Signed)
Ok to refill 90 +1  

## 2018-01-25 NOTE — Telephone Encounter (Signed)
Sent to the pharmacy by e-scribe as instructed. 

## 2018-02-02 ENCOUNTER — Ambulatory Visit: Payer: BC Managed Care – PPO | Admitting: Family

## 2018-02-02 ENCOUNTER — Encounter: Payer: Self-pay | Admitting: Family

## 2018-02-02 VITALS — BP 108/70 | HR 62 | Temp 97.9°F | Ht 69.0 in | Wt 192.1 lb

## 2018-02-02 DIAGNOSIS — H1033 Unspecified acute conjunctivitis, bilateral: Secondary | ICD-10-CM | POA: Diagnosis not present

## 2018-02-02 MED ORDER — TOBRAMYCIN 0.3 % OP SOLN: 1.0000 [drp] | Freq: Four times a day (QID) | OPHTHALMIC | 0 refills | Status: DC

## 2018-02-02 NOTE — Progress Notes (Signed)
  Riley Cardenas is a 37 y.o. male with the following history as recorded in [employer]:  There are no active problems to display for this patient.   Current Outpatient Medications  Medication Sig Dispense Refill  . albuterol (PROVENTIL HFA;VENTOLIN HFA) 108 (90 Base) MCG/ACT inhaler Inhale 2 puffs into the lungs every 6 (six) hours as needed for wheezing or shortness of breath. 1 Inhaler 2  . amphetamine-dextroamphetamine (ADDERALL XR) 30 MG 24 hr capsule     . fluticasone (FLONASE) 50 MCG/ACT nasal spray Place 2 sprays into both nostrils daily. 16 g 6  . omeprazole (PRILOSEC) 20 MG capsule TAKE 1 CAPSULE BY MOUTH EVERY DAY 90 capsule 1  . tobramycin (TOBREX) 0.3 % ophthalmic solution Place 1 drop into both eyes every 6 (six) hours. 10 mL 0   No current facility-administered medications for this visit.     Allergies: Patient has no known allergies.  Past Medical History:  Diagnosis Date  . ADD (attention deficit disorder)   . GERD (gastroesophageal reflux disease)     History reviewed. No pertinent surgical history.  Family History  Problem Relation Age of Onset  . Arthritis Maternal Grandmother   . Hypertension Maternal Grandfather   . Arthritis Maternal Grandfather     Social History   Tobacco Use  . Smoking status: Former Research scientist (life sciences)  . Smokeless tobacco: Never Used  Substance Use Topics  . Alcohol use: Yes    Alcohol/week: 0.0 oz    Comment: occ    Subjective:  Patient presents with concerns for bilateral "pink eye"; woke up in the middle of the night with eyes "matted shut." No pain, no vision changes; does have a 82 month old who has been sick similar symptoms;   Objective:  Vitals:   02/02/18 0806  BP: 108/70  Pulse: 62  Temp: 97.9 F (36.6 C)  TempSrc: Oral  SpO2: 99%  Weight: 192 lb 1.9 oz (87.1 kg)  Height: 5\' 9"  (1.753 m)    General: Well developed, well nourished, in no acute distress  Skin : Warm and dry.  Head: Normocephalic and atraumatic  Eyes: Sclera and  conjunctiva erythematous; matting noted bilaterally; pupils round and reactive to light; extraocular movements intact  Lungs: Respirations unlabored; Neurologic: Alert and oriented; speech intact; face symmetrical; moves all extremities well; CNII-XII intact without focal deficit   Assessment:  1. Acute bacterial conjunctivitis of both eyes     Plan:  Rx for Tobrex 1 gtt OU q 4-6 hours prn; change pillowcases as directed; follow-up worse, no better.  No follow-ups on file.  No orders of the defined types were placed in this encounter.   Requested Prescriptions   Signed Prescriptions Disp Refills  . tobramycin (TOBREX) 0.3 % ophthalmic solution 10 mL 0    Sig: Place 1 drop into both eyes every 6 (six) hours.

## 2018-03-22 ENCOUNTER — Ambulatory Visit: Payer: BC Managed Care – PPO | Admitting: Adult Health

## 2018-03-22 ENCOUNTER — Encounter: Payer: Self-pay | Admitting: Adult Health

## 2018-03-22 VITALS — BP 116/70 | Temp 97.5°F | Wt 193.0 lb

## 2018-03-22 DIAGNOSIS — K2100 Gastro-esophageal reflux disease with esophagitis, without bleeding: Secondary | ICD-10-CM

## 2018-03-22 DIAGNOSIS — M25512 Pain in left shoulder: Secondary | ICD-10-CM

## 2018-03-22 MED ORDER — OMEPRAZOLE 20 MG PO CPDR: 20.0000 mg | DELAYED_RELEASE_CAPSULE | Freq: Two times a day (BID) | ORAL | 1 refills | Status: DC

## 2018-03-22 NOTE — Progress Notes (Signed)
Subjective:    Patient ID: Riley Cardenas, male    DOB: 1980-10-21, 37 y.o.   MRN: [id number]  HPI  37 year old male who  has a past medical history of ADD (attention deficit disorder) and GERD (gastroesophageal reflux disease).  GERD- continues to have acid reflux despite increasing Prilosec to 40 mg daily. He continues to have heart burn and waking up in the morning with a cough. He continues to drink alcohol, eat spicy food, and has not been eating healthy. Spicy food and alcohol make symptoms worse   Left Shoulder Pain - has been present for one month. Pain is worse with movements. Aggravating factors include that of when he throws his child in the air when he is at the pool. Reports " popping" sensation at time. Denies any numbness or tingling in left arm.   Review of Systems See HPI   Past Medical History:  Diagnosis Date  . ADD (attention deficit disorder)   . GERD (gastroesophageal reflux disease)     Social History   Socioeconomic History  . Marital status: Married    Spouse name: Not on file  . Number of children: Not on file  . Years of education: Not on file  . Highest education level: Not on file  Occupational History  . Not on file  Social Needs  . Financial resource strain: Not on file  . Food insecurity:    Worry: Not on file    Inability: Not on file  . Transportation needs:    Medical: Not on file    Non-medical: Not on file  Tobacco Use  . Smoking status: Former Research scientist (life sciences)  . Smokeless tobacco: Never Used  Substance and Sexual Activity  . Alcohol use: Yes    Alcohol/week: 0.0 oz    Comment: occ  . Drug use: No  . Sexual activity: Yes    Partners: Female  Lifestyle  . Physical activity:    Days per week: Not on file    Minutes per session: Not on file  . Stress: Not on file  Relationships  . Social connections:    Talks on phone: Not on file    Gets together: Not on file    Attends religious service: Not on file    Active member of club or  organization: Not on file    Attends meetings of clubs or organizations: Not on file    Relationship status: Not on file  . Intimate partner violence:    Fear of current or ex partner: Not on file    Emotionally abused: Not on file    Physically abused: Not on file    Forced sexual activity: Not on file  Other Topics Concern  . 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 No pertinent surgical history.  Family History  Problem Relation Age of Onset  . Arthritis Maternal Grandmother   . Hypertension Maternal Grandfather   . Arthritis Maternal Grandfather     No Known Allergies  Current Outpatient Medications on File Prior to Visit  Medication Sig Dispense Refill  . albuterol (PROVENTIL HFA;VENTOLIN HFA) 108 (90 Base) MCG/ACT inhaler Inhale 2 puffs into the lungs every 6 (six) hours as needed for wheezing or shortness of breath. 1 Inhaler 2  . amphetamine-dextroamphetamine (ADDERALL XR) 30 MG 24 hr capsule     .  fluticasone (FLONASE) 50 MCG/ACT nasal spray Place 2 sprays into both nostrils daily. 16 g 6  . omeprazole (PRILOSEC) 20 MG capsule TAKE 1 CAPSULE BY MOUTH EVERY DAY (Patient taking differently: Take 1 capsule twice daily) 90 capsule 1  . tobramycin (TOBREX) 0.3 % ophthalmic solution Place 1 drop into both eyes every 6 (six) hours. 10 mL 0   No current facility-administered medications on file prior to visit.     BP 116/70   Temp (!) 97.5 F (36.4 C)   Wt 193 lb (87.5 kg)   BMI 28.50 kg/m       Objective:   Physical Exam  Constitutional: He is oriented to person, place, and time. He appears well-developed and well-nourished. No distress.  Cardiovascular: Normal rate.  Pulmonary/Chest: Effort normal and breath sounds normal.  Abdominal: Soft. Bowel sounds are normal. He exhibits no distension and no mass. There is no tenderness. There is no rebound and no  guarding. No hernia.  Musculoskeletal: Normal range of motion. He exhibits tenderness (bony tenderness to left acromion process ).  Neurological: He is alert and oriented to person, place, and time.  Skin: Skin is warm and dry. Capillary refill takes less than 2 seconds. He is not diaphoretic. No erythema.  Psychiatric: He has a normal mood and affect. His behavior is normal. Judgment and thought content normal.  Nursing note and vitals reviewed.     Assessment & Plan:  1. Gastroesophageal reflux disease with esophagitis - Encouraged diet modification prior to trial of different medication. - Follow up if no improvement after changes in diet - Consider Protonix and/or GI consult  - omeprazole (PRILOSEC) 20 MG capsule; Take 1 capsule (20 mg total) by mouth 2 (two) times daily.  Dispense: 180 capsule; Refill: 1  2. Acute pain of left shoulder - Appears as overuse injury. No concern for Rotator cuff injury. Has full ROM without difficulty.  - Advised rest and tylenol for short course of time  - Follow up if no improvement   Dorothyann Peng, NP

## 2018-08-18 ENCOUNTER — Ambulatory Visit: Payer: BC Managed Care – PPO | Admitting: Family Medicine

## 2018-08-18 ENCOUNTER — Encounter: Payer: Self-pay | Admitting: Family Medicine

## 2018-08-18 VITALS — BP 118/68 | HR 66 | Temp 98.3°F | Ht 69.0 in | Wt 185.6 lb

## 2018-08-18 DIAGNOSIS — R05 Cough: Secondary | ICD-10-CM | POA: Diagnosis not present

## 2018-08-18 DIAGNOSIS — R059 Cough, unspecified: Secondary | ICD-10-CM

## 2018-08-18 MED ORDER — AZITHROMYCIN 250 MG PO TABS: ORAL_TABLET | ORAL | 0 refills | Status: DC

## 2018-08-18 MED ORDER — IPRATROPIUM BROMIDE 0.06 % NA SOLN: 2.0000 | Freq: Four times a day (QID) | NASAL | 0 refills | Status: DC

## 2018-08-18 NOTE — Progress Notes (Signed)
   Subjective:  Riley Cardenas is a 38 y.o. male who presents today for same-day appointment with a chief complaint of cough.   HPI:  Cough, Acute problem Started about 4-5 days ago. Stable over that time. Associated with sputum production, nasal congestion, some blood tinged sputum.Tried mucinex and ibuprofen with modest improvement. Some chills. No fevers. No myalgia. Wife and children has been sick with similar symptoms.No other obvious alleviating or aggravating factors.     ROS: Per HPI  PMH: He reports that he has quit smoking. He has never used smokeless tobacco. He reports current alcohol use. He reports that he does not use drugs.  Objective:  Physical Exam: BP 118/68 (BP Location: Left Arm, Patient Position: Sitting, Cuff Size: Normal)   Pulse 66   Temp 98.3 F (36.8 C) (Oral)   Ht 5\' 9"  (1.753 m)   Wt 185 lb 9.6 oz (84.2 kg)   SpO2 97%   BMI 27.41 kg/m   Gen: NAD, resting comfortably HEENT: TMs with clear effusion.  OP slightly erythematous with no exudate.  There is mucosa erythematous and boggy bilaterally. CV: RRR with no murmurs appreciated Pulm: NWOB, CTAB with no crackles, wheezes, or rhonchi  Assessment/Plan:  Cough Likely secondary to viral URI. No signs of bacterial infection. Start atrovent for rhinorrhea/sinus congestion. Sent in a "pocket prescription" for azithromycin with strict instruction to not start unless symptoms worsen or fail to improve within the next several days. Recommended tylenol and/or motrin as needed for low grade fever and pain. Encouraged good oral hydration. Return precautions reviewed. Follow up as needed.   Algis Greenhouse. Jerline Pain, MD 08/18/2018 12:12 PM

## 2018-08-18 NOTE — Patient Instructions (Signed)
Start the atrovent.  Start the zpack if your symptoms worsen or do not improve in a few days.  Please stay well hydrated.  You can take tylenol and/or motrin as needed for low grade fever and pain.  Please let me know if your symptoms worsen or fail to improve.  Take care, Dr Jerline Pain

## 2018-09-23 ENCOUNTER — Other Ambulatory Visit: Payer: Self-pay | Admitting: Family Medicine

## 2018-09-24 ENCOUNTER — Other Ambulatory Visit: Payer: Self-pay | Admitting: Adult Health

## 2018-09-24 DIAGNOSIS — K2100 Gastro-esophageal reflux disease with esophagitis, without bleeding: Secondary | ICD-10-CM

## 2018-09-27 NOTE — Telephone Encounter (Signed)
Don't see that the pt has had a cpx since 2017.  Was seen in 03/2018 for this problem. Please advise.

## 2019-01-13 ENCOUNTER — Encounter: Payer: Self-pay | Admitting: Adult Health

## 2019-01-17 NOTE — Telephone Encounter (Signed)
Call patient to make an appointment. The patients voice mail is full.

## 2019-01-20 ENCOUNTER — Ambulatory Visit: Payer: BC Managed Care – PPO | Admitting: Adult Health

## 2019-01-24 ENCOUNTER — Ambulatory Visit (INDEPENDENT_AMBULATORY_CARE_PROVIDER_SITE_OTHER): Payer: BC Managed Care – PPO | Admitting: Adult Health

## 2019-01-24 ENCOUNTER — Other Ambulatory Visit: Payer: Self-pay

## 2019-01-24 ENCOUNTER — Encounter: Payer: Self-pay | Admitting: Adult Health

## 2019-01-24 DIAGNOSIS — K2100 Gastro-esophageal reflux disease with esophagitis, without bleeding: Secondary | ICD-10-CM

## 2019-01-24 MED ORDER — PANTOPRAZOLE SODIUM 40 MG PO TBEC: 40.0000 mg | DELAYED_RELEASE_TABLET | Freq: Every day | ORAL | 3 refills | Status: DC

## 2019-01-24 NOTE — Progress Notes (Signed)
Virtual Visit via Video Note  I connected with Riley Cardenas on 01/24/19 at  1:00 PM EDT by a video enabled telemedicine application and verified that I am speaking with the correct person using two identifiers.  Location patient: home Location provider:work or home office Persons participating in the virtual visit: patient, provider  I discussed the limitations of evaluation and management by telemedicine and the availability of in person appointments. The patient expressed understanding and agreed to proceed.   HPI:  38 year old male who is being evaluated today for GERD.  Currently is prescribed Prilosec 20 mg twice daily.  Over the last month he has been experiencing a dry cough in the middle the night that wakes him from sleep.  Denies burning sensation or sour taste in his mouth.  Only happens when he is laying down.  He has tried earlier in the evening and sleeping propped up on pillows.  He did not notice any improvement.  He has been working on his diet and making healthier choices.   ROS: See pertinent positives and negatives per HPI.  Past Medical History:  Diagnosis Date  . ADD (attention deficit disorder)   . GERD (gastroesophageal reflux disease)     No past surgical history on file.  Family History  Problem Relation Age of Onset  . Arthritis Maternal Grandmother   . Hypertension Maternal Grandfather   . Arthritis Maternal Grandfather      Current Outpatient Medications:  .  albuterol (PROVENTIL HFA;VENTOLIN HFA) 108 (90 Base) MCG/ACT inhaler, Inhale 2 puffs into the lungs every 6 (six) hours as needed for wheezing or shortness of breath., Disp: 1 Inhaler, Rfl: 2 .  amphetamine-dextroamphetamine (ADDERALL XR) 30 MG 24 hr capsule, , Disp: , Rfl:  .  azithromycin (ZITHROMAX) 250 MG tablet, Take 2 tabs day 1, then 1 tab daily, Disp: 6 each, Rfl: 0 .  fluticasone (FLONASE) 50 MCG/ACT nasal spray, Place 2 sprays into both nostrils daily. (Patient not taking: Reported on  08/18/2018), Disp: 16 g, Rfl: 6 .  ipratropium (ATROVENT) 0.06 % nasal spray, Place 2 sprays into both nostrils 4 (four) times daily., Disp: 15 mL, Rfl: 0 .  pantoprazole (PROTONIX) 40 MG tablet, Take 1 tablet (40 mg total) by mouth daily., Disp: 30 tablet, Rfl: 3  EXAM:  VITALS per patient if applicable:  GENERAL: alert, oriented, appears well and in no acute distress  HEENT: atraumatic, conjunttiva clear, no obvious abnormalities on inspection of external nose and ears  NECK: normal movements of the head and neck  LUNGS: on inspection no signs of respiratory distress, breathing rate appears normal, no obvious gross SOB, gasping or wheezing  CV: no obvious cyanosis  MS: moves all visible extremities without noticeable abnormality  PSYCH/NEURO: pleasant and cooperative, no obvious depression or anxiety, speech and thought processing grossly intact  ASSESSMENT AND PLAN:  Discussed the following assessment and plan:  We will DC Prilosec and trial Protonix daily.  Ice follow-up if no improvement in the next 2 to 3 weeks.  Consider referral to GI  Gastroesophageal reflux disease with esophagitis - Plan: pantoprazole (PROTONIX) 40 MG tablet     I discussed the assessment and treatment plan with the patient. The patient was provided an opportunity to ask questions and all were answered. The patient agreed with the plan and demonstrated an understanding of the instructions.   The patient was advised to call back or seek an in-person evaluation if the symptoms worsen or if the condition fails to improve  as anticipated.   Dorothyann Peng, NP

## 2019-04-07 ENCOUNTER — Encounter: Payer: BC Managed Care – PPO | Admitting: Adult Health

## 2019-04-12 ENCOUNTER — Other Ambulatory Visit: Payer: Self-pay

## 2019-04-12 ENCOUNTER — Encounter: Payer: Self-pay | Admitting: Adult Health

## 2019-04-12 ENCOUNTER — Ambulatory Visit (INDEPENDENT_AMBULATORY_CARE_PROVIDER_SITE_OTHER): Payer: BC Managed Care – PPO | Admitting: Adult Health

## 2019-04-12 VITALS — BP 110/74 | Temp 98.3°F | Ht 70.25 in | Wt 191.0 lb

## 2019-04-12 DIAGNOSIS — K2100 Gastro-esophageal reflux disease with esophagitis, without bleeding: Secondary | ICD-10-CM

## 2019-04-12 DIAGNOSIS — Z0000 Encounter for general adult medical examination without abnormal findings: Secondary | ICD-10-CM

## 2019-04-12 LAB — LIPID PANEL
Cholesterol: 160 mg/dL (ref 0–200)
HDL: 38.5 mg/dL — ABNORMAL LOW (ref >39.00)
LDL Cholesterol: 105 mg/dL — ABNORMAL HIGH (ref 0–99)
NonHDL: 121.82
Total CHOL/HDL Ratio: 4
Triglycerides: 84 mg/dL (ref 0.0–149.0)
VLDL: 16.8 mg/dL (ref 0.0–40.0)

## 2019-04-12 LAB — COMPREHENSIVE METABOLIC PANEL
ALT: 12 U/L (ref 0–53)
AST: 17 U/L (ref 0–37)
Albumin: 4.7 g/dL (ref 3.5–5.2)
Alkaline Phosphatase: 72 U/L (ref 39–117)
BUN: 9 mg/dL (ref 6–23)
CO2: 30 mEq/L (ref 19–32)
Calcium: 9.5 mg/dL (ref 8.4–10.5)
Chloride: 100 mEq/L (ref 96–112)
Creatinine, Ser: 0.81 mg/dL (ref 0.40–1.50)
GFR: 106.02 mL/min (ref >60.00)
Glucose, Bld: 79 mg/dL (ref 70–99)
Potassium: 4.3 mEq/L (ref 3.5–5.1)
Sodium: 139 mEq/L (ref 135–145)
Total Bilirubin: 1 mg/dL (ref 0.2–1.2)
Total Protein: 7.1 g/dL (ref 6.0–8.3)

## 2019-04-12 LAB — CBC WITH DIFFERENTIAL/PLATELET
Basophils Absolute: 0.1 10*3/uL (ref 0.0–0.1)
Basophils Relative: 0.8 % (ref 0.0–3.0)
Eosinophils Absolute: 0.4 10*3/uL (ref 0.0–0.7)
Eosinophils Relative: 3.8 % (ref 0.0–5.0)
HCT: 45.5 % (ref 39.0–52.0)
Hemoglobin: 15.6 g/dL (ref 13.0–17.0)
Lymphocytes Relative: 20.8 % (ref 12.0–46.0)
Lymphs Abs: 1.9 10*3/uL (ref 0.7–4.0)
MCHC: 34.3 g/dL (ref 30.0–36.0)
MCV: 89 fl (ref 78.0–100.0)
Monocytes Absolute: 0.7 10*3/uL (ref 0.1–1.0)
Monocytes Relative: 7.6 % (ref 3.0–12.0)
Neutro Abs: 6.2 10*3/uL (ref 1.4–7.7)
Neutrophils Relative %: 67 % (ref 43.0–77.0)
Platelets: 217 10*3/uL (ref 150.0–400.0)
RBC: 5.11 Mil/uL (ref 4.22–5.81)
RDW: 13.1 % (ref 11.5–15.5)
WBC: 9.3 10*3/uL (ref 4.0–10.5)

## 2019-04-12 NOTE — Progress Notes (Signed)
Subjective:    Patient ID: Riley Cardenas, male    DOB: 01/29/1981, 38 y.o.   MRN: [id number]  HPI Patient presents for yearly preventative medicine examination. He is a pleasant 38 year old male who  has a past medical history of ADD (attention deficit disorder) and GERD (gastroesophageal reflux disease).  GERD - Controlled with Protonix. He continues to have mild cough at night. Denies abdominal pain or foul taste in mouth when he wakes up.   ADHD - Takes Adderall 30 mg tablets. This is prescribed by [facility]   All immunizations and health maintenance protocols were reviewed with the patient and needed orders were placed.  Appropriate screening laboratory values were ordered for the patient including screening of hyperlipidemia, renal function and hepatic function.  Medication reconciliation,  past medical history, social history, problem list and allergies were reviewed in detail with the patient  Goals were established with regard to weight loss, exercise, and  diet in compliance with medications  Wt Readings from Last 3 Encounters:  04/12/19 191 lb (86.6 kg)  08/18/18 185 lb 9.6 oz (84.2 kg)  03/22/18 193 lb (87.5 kg)    Review of Systems  Constitutional: Negative.   HENT: Negative.   Eyes: Negative.   Respiratory: Negative.   Cardiovascular: Negative.   Gastrointestinal: Negative.   Endocrine: Negative.   Genitourinary: Negative.   Musculoskeletal: Negative.   Skin: Negative.   Allergic/Immunologic: Negative.   Neurological: Negative.   Hematological: Negative.   Psychiatric/Behavioral: Negative.   All other systems reviewed and are negative.  Past Medical History:  Diagnosis Date  . ADD (attention deficit disorder)   . GERD (gastroesophageal reflux disease)     Social History   Socioeconomic History  . Marital status: Married    Spouse name: Not on file  . Number of children: Not on file  . Years of education: Not on file  . Highest  education level: Not on file  Occupational History  . Not on file  Social Needs  . Financial resource strain: Not on file  . Food insecurity    Worry: Not on file    Inability: Not on file  . Transportation needs    Medical: Not on file    Non-medical: Not on file  Tobacco Use  . Smoking status: Former Research scientist (life sciences)  . Smokeless tobacco: Never Used  Substance and Sexual Activity  . Alcohol use: Yes    Alcohol/week: 0.0 standard drinks    Comment: occ  . Drug use: No  . Sexual activity: Yes    Partners: Female  Lifestyle  . Physical activity    Days per week: Not on file    Minutes per session: Not on file  . Stress: Not on file  Relationships  . Social Herbalist on phone: Not on file    Gets together: Not on file    Attends religious service: Not on file    Active member of club or organization: Not on file    Attends meetings of clubs or organizations: Not on file    Relationship status: Not on file  . Intimate partner violence    Fear of current or ex partner: Not on file    Emotionally abused: Not on file    Physically abused: Not on file    Forced sexual activity: Not on file  Other Topics Concern  . 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    Married    18 month old son     History reviewed. No pertinent surgical history.  Family History  Problem Relation Age of Onset  . Arthritis Maternal Grandmother   . Hypertension Maternal Grandfather   . Arthritis Maternal Grandfather     No Known Allergies  Current Outpatient Medications on File Prior to Visit  Medication Sig Dispense Refill  . albuterol (PROVENTIL HFA;VENTOLIN HFA) 108 (90 Base) MCG/ACT inhaler Inhale 2 puffs into the lungs every 6 (six) hours as needed for wheezing or shortness of breath. 1 Inhaler 2  . amphetamine-dextroamphetamine (ADDERALL XR) 30 MG 24 hr capsule     . pantoprazole (PROTONIX) 40 MG tablet Take 1 tablet (40 mg total) by  mouth daily. 30 tablet 3   No current facility-administered medications on file prior to visit.     BP 110/74   Temp 98.3 F (36.8 C)   Ht 5' 10.25" (1.784 m)   Wt 191 lb (86.6 kg)   BMI 27.21 kg/m       Objective:   Physical Exam Vitals signs and nursing note reviewed.  Constitutional:      General: He is not in acute distress.    Appearance: Normal appearance. He is normal weight. He is not diaphoretic.  HENT:     Head: Normocephalic and atraumatic.     Right Ear: Tympanic membrane, ear canal and external ear normal. There is no impacted cerumen.     Left Ear: Tympanic membrane, ear canal and external ear normal. There is no impacted cerumen.     Nose: Nose normal. No congestion or rhinorrhea.     Mouth/Throat:     Mouth: Mucous membranes are moist.     Pharynx: Oropharynx is clear. No oropharyngeal exudate.  Eyes:     General: No scleral icterus.       Right eye: No discharge.        Left eye: No discharge.     Conjunctiva/sclera: Conjunctivae normal.     Pupils: Pupils are equal, round, and reactive to light.  Neck:     Musculoskeletal: Normal range of motion and neck supple.     Thyroid: No thyromegaly.     Vascular: No JVD.     Trachea: No tracheal deviation.  Cardiovascular:     Rate and Rhythm: Normal rate and regular rhythm.     Pulses: Normal pulses.     Heart sounds: Normal heart sounds. No murmur. No friction rub. No gallop.   Pulmonary:     Effort: Pulmonary effort is normal. No respiratory distress.     Breath sounds: Normal breath sounds. No stridor. No wheezing or rales.  Chest:     Chest wall: No tenderness.  Abdominal:     General: Bowel sounds are normal. There is no distension.     Palpations: Abdomen is soft. There is no mass.     Tenderness: There is no abdominal tenderness. There is no right CVA tenderness, left CVA tenderness, guarding or rebound.     Hernia: No hernia is present.  Musculoskeletal: Normal range of motion.        General:  No swelling, tenderness, deformity or signs of injury.     Right lower leg: No edema.     Left lower leg: No edema.  Lymphadenopathy:     Cervical: No cervical adenopathy.  Skin:    General: Skin is warm and dry.     Coloration: Skin is not  jaundiced or pale.     Findings: No bruising, erythema, lesion or rash.  Neurological:     General: No focal deficit present.     Mental Status: He is alert and oriented to person, place, and time. Mental status is at baseline.     Cranial Nerves: No cranial nerve deficit.     Motor: No abnormal muscle tone.     Coordination: Coordination normal.     Deep Tendon Reflexes: Reflexes are normal and symmetric. Reflexes normal.  Psychiatric:        Mood and Affect: Mood normal.        Behavior: Behavior normal.        Thought Content: Thought content normal.        Judgment: Judgment normal.       Assessment & Plan:  1. Routine general medical examination at a health care facility - Continue to exercise and eat healthy  - Follow up in one year or sooner if needed - CBC with Differential/Platelet - Comprehensive metabolic panel - Lipid panel - TSH  2. Gastroesophageal reflux disease with esophagitis - Change protonix to nightly dosing. Use a humidifier in the bedroom  - CBC with Differential/Platelet - Comprehensive metabolic panel - Lipid panel - TSH

## 2019-04-13 LAB — TSH: TSH: 1.69 u[IU]/mL (ref 0.35–4.50)

## 2019-04-14 ENCOUNTER — Telehealth: Payer: Self-pay

## 2019-04-14 NOTE — Telephone Encounter (Signed)
Prior authorization submitted and approved for amphetamine-dextroamphetamine XR 30 mg through covermymeds with [facility] effective 04/14/2019 - 04/13/2022

## 2019-05-04 ENCOUNTER — Other Ambulatory Visit: Payer: Self-pay

## 2019-05-04 ENCOUNTER — Encounter: Payer: Self-pay | Admitting: Adult Health

## 2019-05-04 ENCOUNTER — Ambulatory Visit (INDEPENDENT_AMBULATORY_CARE_PROVIDER_SITE_OTHER): Payer: BC Managed Care – PPO | Admitting: Adult Health

## 2019-05-04 DIAGNOSIS — F909 Attention-deficit hyperactivity disorder, unspecified type: Secondary | ICD-10-CM | POA: Insufficient documentation

## 2019-05-04 DIAGNOSIS — K219 Gastro-esophageal reflux disease without esophagitis: Secondary | ICD-10-CM | POA: Insufficient documentation

## 2019-05-04 MED ORDER — AMPHETAMINE-DEXTROAMPHET ER 30 MG PO CP24: 30.0000 mg | ORAL_CAPSULE | Freq: Every day | ORAL | 0 refills | Status: DC

## 2019-05-04 MED ORDER — AMPHETAMINE-DEXTROAMPHETAMINE 20 MG PO TABS: 20.0000 mg | ORAL_TABLET | Freq: Every day | ORAL | 0 refills | Status: DC

## 2019-05-04 NOTE — Progress Notes (Signed)
Chayson Grall [id number] 1980-09-05 38 y.o.  Subjective:   Patient ID:  Riley Cardenas is a 38 y.o. (DOB [birth year]) male.  Chief Complaint: No chief complaint on file.   HPI Riley Cardenas presents to the office today for follow-up of ADHD  Describes mood today as "ok". Pleasant. Mood symptoms - denies depression, anxiety, and irritability. Pleasant. Stating "I've been struggling a lot more lately". Increased stress with returning to work. Stable interest and motivation. Taking medications as prescribed.  Energy levels stable. Active, does not have a regular exercise routine. Plans to start running again. Works full-time as a Pharmacist, hospital. Enjoys some usual interests and activities. Spending time with family - wife and 2 sons. Appetite adequate. Weight gain - 10 or more pounds. Sleeps better some nights than others. Averages 6 to 7 hours. Has trouble shutting mind off to sleep at night.  Focus and concentration stable. Completing tasks. Managing aspects of household.  Denies SI or HI. Denies AH or VH.   Review of Systems:  Review of Systems  Musculoskeletal: Negative for gait problem.  Neurological: Negative for tremors.  Psychiatric/Behavioral:       Please refer to HPI    Medications: I have reviewed the patient's current medications.  Current Outpatient Medications  Medication Sig Dispense Refill  . albuterol (PROVENTIL HFA;VENTOLIN HFA) 108 (90 Base) MCG/ACT inhaler Inhale 2 puffs into the lungs every 6 (six) hours as needed for wheezing or shortness of breath. 1 Inhaler 2  . amphetamine-dextroamphetamine (ADDERALL XR) 30 MG 24 hr capsule     . pantoprazole (PROTONIX) 40 MG tablet Take 1 tablet (40 mg total) by mouth daily. 30 tablet 3   No current facility-administered medications for this visit.     Medication Side Effects: None  Allergies: No Known Allergies  Past Medical History:  Diagnosis Date  . ADD (attention deficit disorder)   . GERD (gastroesophageal reflux disease)      Family History  Problem Relation Age of Onset  . Arthritis Maternal Grandmother   . Hypertension Maternal Grandfather   . Arthritis Maternal Grandfather     Social History   Socioeconomic History  . Marital status: Married    Spouse name: Not on file  . Number of children: Not on file  . Years of education: Not on file  . Highest education level: Not on file  Occupational History  . Not on file  Social Needs  . Financial resource strain: Not on file  . Food insecurity    Worry: Not on file    Inability: Not on file  . Transportation needs    Medical: Not on file    Non-medical: Not on file  Tobacco Use  . Smoking status: Former Research scientist (life sciences)  . Smokeless tobacco: Never Used  Substance and Sexual Activity  . Alcohol use: Yes    Alcohol/week: 0.0 standard drinks    Comment: occ  . Drug use: No  . Sexual activity: Yes    Partners: Female  Lifestyle  . Physical activity    Days per week: Not on file    Minutes per session: Not on file  . Stress: Not on file  Relationships  . Social Herbalist on phone: Not on file    Gets together: Not on file    Attends religious service: Not on file    Active member of club or organization: Not on file    Attends meetings of clubs or organizations: Not on file    Relationship  status: Not on file  . Intimate partner violence    Fear of current or ex partner: Not on file    Emotionally abused: Not on file    Physically abused: Not on file    Forced sexual activity: Not on file  Other Topics Concern  . 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    Married    55 month old son     Past Medical History, Surgical history, Social history, and Family history were reviewed and updated as appropriate.   Please see review of systems for further details on the patient's review from today.   Objective:   Physical Exam:  There were no vitals taken for this visit.  Physical  Exam Constitutional:      General: He is not in acute distress.    Appearance: He is well-developed.  Musculoskeletal:        General: No deformity.  Neurological:     Mental Status: He is alert and oriented to person, place, and time.     Coordination: Coordination normal.  Psychiatric:        Attention and Perception: Attention and perception normal. He does not perceive auditory or visual hallucinations.        Mood and Affect: Mood normal. Mood is not anxious or depressed. Affect is not labile, blunt, angry or inappropriate.        Speech: Speech normal.        Behavior: Behavior normal.        Thought Content: Thought content normal. Thought content is not paranoid or delusional. Thought content does not include homicidal or suicidal ideation. Thought content does not include homicidal or suicidal plan.        Cognition and Memory: Cognition and memory normal.        Judgment: Judgment normal.     Comments: Insight intact     Lab Review:     Component Value Date/Time   NA 139 04/12/2019 0733   K 4.3 04/12/2019 0733   CL 100 04/12/2019 0733   CO2 30 04/12/2019 0733   GLUCOSE 79 04/12/2019 0733   BUN 9 04/12/2019 0733   CREATININE 0.81 04/12/2019 0733   CALCIUM 9.5 04/12/2019 0733   PROT 7.1 04/12/2019 0733   ALBUMIN 4.7 04/12/2019 0733   AST 17 04/12/2019 0733   ALT 12 04/12/2019 0733   ALKPHOS 72 04/12/2019 0733   BILITOT 1.0 04/12/2019 0733       Component Value Date/Time   WBC 9.3 04/12/2019 0733   RBC 5.11 04/12/2019 0733   HGB 15.6 04/12/2019 0733   HCT 45.5 04/12/2019 0733   PLT 217.0 04/12/2019 0733   MCV 89.0 04/12/2019 0733   MCHC 34.3 04/12/2019 0733   RDW 13.1 04/12/2019 0733   LYMPHSABS 1.9 04/12/2019 0733   MONOABS 0.7 04/12/2019 0733   EOSABS 0.4 04/12/2019 0733   BASOSABS 0.1 04/12/2019 0733    No results found for: POCLITH, LITHIUM   No results found for: PHENYTOIN, PHENOBARB, VALPROATE, CBMZ   .res Assessment: Plan:    Plan:  1.  Adderall XR 30mg  BID  2. Adderall 20mg  daily - 1 script sent  RTC 3 months  Patient advised to contact office with any questions, adverse effects, or acute worsening in signs and symptoms.  Discussed potential benefits, risks, and side effects of stimulants with patient to include increased heart rate, palpitations, insomnia, increased anxiety, increased irritability, or  decreased appetite.  Instructed patient to contact office if experiencing any significant tolerability issues.  There are no diagnoses linked to this encounter.   Please see After Visit Summary for patient specific instructions.  No future appointments.  No orders of the defined types were placed in this encounter.   -------------------------------

## 2019-05-22 ENCOUNTER — Other Ambulatory Visit: Payer: Self-pay | Admitting: Adult Health

## 2019-05-22 DIAGNOSIS — K2100 Gastro-esophageal reflux disease with esophagitis, without bleeding: Secondary | ICD-10-CM

## 2019-05-24 NOTE — Telephone Encounter (Signed)
Sent to the pharmacy by e-scribe. 

## 2019-08-03 ENCOUNTER — Ambulatory Visit (INDEPENDENT_AMBULATORY_CARE_PROVIDER_SITE_OTHER): Payer: BC Managed Care – PPO | Admitting: Adult Health

## 2019-08-03 ENCOUNTER — Encounter: Payer: Self-pay | Admitting: Adult Health

## 2019-08-03 ENCOUNTER — Other Ambulatory Visit: Payer: Self-pay

## 2019-08-03 DIAGNOSIS — F909 Attention-deficit hyperactivity disorder, unspecified type: Secondary | ICD-10-CM | POA: Diagnosis not present

## 2019-08-03 MED ORDER — AMPHETAMINE-DEXTROAMPHETAMINE 20 MG PO TABS: 20.0000 mg | ORAL_TABLET | Freq: Every day | ORAL | 0 refills | Status: DC

## 2019-08-03 MED ORDER — AMPHETAMINE-DEXTROAMPHET ER 30 MG PO CP24: 30.0000 mg | ORAL_CAPSULE | Freq: Every day | ORAL | 0 refills | Status: DC

## 2019-08-03 NOTE — Progress Notes (Signed)
Riley Cardenas [id number] Aug 25, 1980 38 y.o.  Subjective:   Patient ID:  Riley Cardenas is a 38 y.o. (DOB Jul 14, 1981) male.  Chief Complaint: No chief complaint on file.   HPI Riley Cardenas presents to the office today for follow-up of ADHD  Describes mood today as "ok". Pleasant. Mood symptoms - denies depression, anxiety, and irritability. Stating "things are going much better". Not as "bubbly as I would like to be". Spending Christmas at [employer]. Decreased stress in work setting. Stable interest and motivation. Taking medications as prescribed.  Energy levels stable. Active, does not have a regular exercise routine. Runs some days. Works full-time as a Pharmacist, hospital. Enjoys some usual interests and activities. Married x 6 years. Lives with wife and 2 sons.  Appetite adequate. Weight gain sice Covid - has stabilized.  Sleep has improved. Averages 6 to 7 hours.   Focus and concentration stable. Completing tasks. Managing aspects of household.  Denies SI or HI. Denies AH or VH.  PHQ2-9     Office Visit from 04/12/2019 in [place] at [facility] Total Score  0      Review of Systems:  Review of Systems  Musculoskeletal: Negative for gait problem.  Neurological: Negative for tremors.  Psychiatric/Behavioral:       Please refer to HPI    Medications: I have reviewed the patient's current medications.  Current Outpatient Medications  Medication Sig Dispense Refill  . albuterol (PROVENTIL HFA;VENTOLIN HFA) 108 (90 Base) MCG/ACT inhaler Inhale 2 puffs into the lungs every 6 (six) hours as needed for wheezing or shortness of breath. 1 Inhaler 2  . amphetamine-dextroamphetamine (ADDERALL XR) 30 MG 24 hr capsule Take 1 capsule (30 mg total) by mouth daily. 30 capsule 0  . [START ON 08/31/2019] amphetamine-dextroamphetamine (ADDERALL XR) 30 MG 24 hr capsule Take 1 capsule (30 mg total) by mouth daily. 30 capsule 0  . [START ON 09/28/2019] amphetamine-dextroamphetamine (ADDERALL XR) 30 MG  24 hr capsule Take 1 capsule (30 mg total) by mouth daily. 30 capsule 0  . amphetamine-dextroamphetamine (ADDERALL) 20 MG tablet Take 1 tablet (20 mg total) by mouth daily. 30 tablet 0  . pantoprazole (PROTONIX) 40 MG tablet TAKE 1 TABLET BY MOUTH EVERY DAY 90 tablet 2   No current facility-administered medications for this visit.    Medication Side Effects: None  Allergies: No Known Allergies  Past Medical History:  Diagnosis Date  . ADD (attention deficit disorder)   . GERD (gastroesophageal reflux disease)     Family History  Problem Relation Age of Onset  . Arthritis Maternal Grandmother   . Hypertension Maternal Grandfather   . Arthritis Maternal Grandfather     Social History   Socioeconomic History  . Marital status: Married    Spouse name: Not on file  . Number of children: Not on file  . Years of education: Not on file  . Highest education level: Not on file  Occupational History  . Not on file  Tobacco Use  . Smoking status: Former Research scientist (life sciences)  . Smokeless tobacco: Never Used  Substance and Sexual Activity  . Alcohol use: Yes    Alcohol/week: 0.0 standard drinks    Comment: occ  . Drug use: No  . Sexual activity: Yes    Partners: Female  Other Topics Concern  . 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    Married    8 month old son  Social Determinants of Health   Financial Resource Strain:   . Difficulty of Paying Living Expenses: Not on file  Food Insecurity:   . Worried About Charity fundraiser in the Last Year: Not on file  . Ran Out of Food in the Last Year: Not on file  Transportation Needs:   . Lack of Transportation (Medical): Not on file  . Lack of Transportation (Non-Medical): Not on file  Physical Activity:   . Days of Exercise per Week: Not on file  . Minutes of Exercise per Session: Not on file  Stress:   . Feeling of Stress : Not on file  Social Connections:   . Frequency of  Communication with Friends and Family: Not on file  . Frequency of Social Gatherings with Friends and Family: Not on file  . Attends Religious Services: Not on file  . Active Member of Clubs or Organizations: Not on file  . Attends Archivist Meetings: Not on file  . Marital Status: Not on file  Intimate Partner Violence:   . Fear of Current or Ex-Partner: Not on file  . Emotionally Abused: Not on file  . Physically Abused: Not on file  . Sexually Abused: Not on file    Past Medical History, Surgical history, Social history, and Family history were reviewed and updated as appropriate.   Please see review of systems for further details on the patient's review from today.   Objective:   Physical Exam:  There were no vitals taken for this visit.  Physical Exam Constitutional:      General: He is not in acute distress.    Appearance: He is well-developed.  Musculoskeletal:        General: No deformity.  Neurological:     Mental Status: He is alert and oriented to person, place, and time.     Coordination: Coordination normal.  Psychiatric:        Attention and Perception: Attention and perception normal. He does not perceive auditory or visual hallucinations.        Mood and Affect: Mood normal. Mood is not anxious or depressed. Affect is not labile, blunt, angry or inappropriate.        Speech: Speech normal.        Behavior: Behavior normal.        Thought Content: Thought content normal. Thought content is not paranoid or delusional. Thought content does not include homicidal or suicidal ideation. Thought content does not include homicidal or suicidal plan.        Cognition and Memory: Cognition and memory normal.        Judgment: Judgment normal.     Comments: Insight intact    Lab Review:     Component Value Date/Time   NA 139 04/12/2019 0733   K 4.3 04/12/2019 0733   CL 100 04/12/2019 0733   CO2 30 04/12/2019 0733   GLUCOSE 79 04/12/2019 0733   BUN 9  04/12/2019 0733   CREATININE 0.81 04/12/2019 0733   CALCIUM 9.5 04/12/2019 0733   PROT 7.1 04/12/2019 0733   ALBUMIN 4.7 04/12/2019 0733   AST 17 04/12/2019 0733   ALT 12 04/12/2019 0733   ALKPHOS 72 04/12/2019 0733   BILITOT 1.0 04/12/2019 0733       Component Value Date/Time   WBC 9.3 04/12/2019 0733   RBC 5.11 04/12/2019 0733   HGB 15.6 04/12/2019 0733   HCT 45.5 04/12/2019 0733   PLT 217.0 04/12/2019 0733   MCV  89.0 04/12/2019 0733   MCHC 34.3 04/12/2019 0733   RDW 13.1 04/12/2019 0733   LYMPHSABS 1.9 04/12/2019 0733   MONOABS 0.7 04/12/2019 0733   EOSABS 0.4 04/12/2019 0733   BASOSABS 0.1 04/12/2019 0733    No results found for: POCLITH, LITHIUM   No results found for: PHENYTOIN, PHENOBARB, VALPROATE, CBMZ   .res Assessment: Plan:    Plan:  1. Adderall XR 30mg  BID  2. Adderall 20mg  daily - 1 script sent  RTC 3 months  Patient advised to contact office with any questions, adverse effects, or acute worsening in signs and symptoms.  Discussed potential benefits, risks, and side effects of stimulants with patient to include increased heart rate, palpitations, insomnia, increased anxiety, increased irritability, or decreased appetite.  Instructed patient to contact office if experiencing any significant tolerability issues.    Diagnoses and all orders for this visit:  Attention deficit hyperactivity disorder (ADHD), unspecified ADHD type -     amphetamine-dextroamphetamine (ADDERALL XR) 30 MG 24 hr capsule; Take 1 capsule (30 mg total) by mouth daily. -     amphetamine-dextroamphetamine (ADDERALL XR) 30 MG 24 hr capsule; Take 1 capsule (30 mg total) by mouth daily. -     amphetamine-dextroamphetamine (ADDERALL XR) 30 MG 24 hr capsule; Take 1 capsule (30 mg total) by mouth daily. -     amphetamine-dextroamphetamine (ADDERALL) 20 MG tablet; Take 1 tablet (20 mg total) by mouth daily.     Please see After Visit Summary for patient specific instructions.  No  future appointments.  No orders of the defined types were placed in this encounter.   -------------------------------

## 2019-11-01 ENCOUNTER — Encounter: Payer: Self-pay | Admitting: Adult Health

## 2019-11-01 ENCOUNTER — Ambulatory Visit (INDEPENDENT_AMBULATORY_CARE_PROVIDER_SITE_OTHER): Payer: BC Managed Care – PPO | Admitting: Adult Health

## 2019-11-01 DIAGNOSIS — F909 Attention-deficit hyperactivity disorder, unspecified type: Secondary | ICD-10-CM

## 2019-11-01 MED ORDER — AMPHETAMINE-DEXTROAMPHET ER 30 MG PO CP24: 30.0000 mg | ORAL_CAPSULE | Freq: Every day | ORAL | 0 refills | Status: DC

## 2019-11-01 MED ORDER — AMPHETAMINE-DEXTROAMPHETAMINE 20 MG PO TABS: 20.0000 mg | ORAL_TABLET | Freq: Every day | ORAL | 0 refills | Status: DC

## 2019-11-01 NOTE — Progress Notes (Signed)
Kahler Wehbe [id number] 13-Apr-1981 39 y.o.  Virtual Visit via Telephone Note  I connected with pt on 11/01/19 at  4:20 PM EDT by telephone and verified that I am speaking with the correct person using two identifiers.   I discussed the limitations, risks, security and privacy concerns of performing an evaluation and management service by telephone and the availability of in person appointments. I also discussed with the patient that there may be a patient responsible charge related to this service. The patient expressed understanding and agreed to proceed.   I discussed the assessment and treatment plan with the patient. The patient was provided an opportunity to ask questions and all were answered. The patient agreed with the plan and demonstrated an understanding of the instructions.   The patient was advised to call back or seek an in-person evaluation if the symptoms worsen or if the condition fails to improve as anticipated. Provided 30 minutes of non-face-to-face time during this encounter.  The patient was located at home.  The provider was located at [facility].   Aloha Gell, NP   Subjective:   Patient ID:  Riley Cardenas is a 39 y.o. (DOB [birth year]) male.  Chief Complaint: No chief complaint on file.   HPI   Riley Cardenas presents for follow-up of ADHD.  Describes mood today as "ok". Pleasant. Mood symptoms - denies depression, anxiety, and irritability. Stating "things are going alright". Working a "hybrid" schedule at school. Not "enjoying" job right now. Focusing on "what I can do and not what I can't". Stable interest and motivation. Taking medications as prescribed.  Energy levels stable. Active, has a regular exercise routine. Running 15 miles a week. Works full-time as a Pharmacist, hospital. Enjoys some usual interests and activities. Married. Lives with wife of 6 years and his 2 sons.  Appetite adequate. Weight loss 25 pounds.  Sleep has improved. Averages 6 to 8 hours.  Has a new Jeannie Done tracking his sleep. Working on healthy sleep habits. Focus and concentration stable. Completing tasks. Managing aspects of household.  Denies SI or HI. Denies AH or VH.  Review of Systems:  Review of Systems  Musculoskeletal: Negative for gait problem.  Neurological: Negative for tremors.  Psychiatric/Behavioral:       Please refer to HPI    Medications: I have reviewed the patient's current medications.  Current Outpatient Medications  Medication Sig Dispense Refill  . albuterol (PROVENTIL HFA;VENTOLIN HFA) 108 (90 Base) MCG/ACT inhaler Inhale 2 puffs into the lungs every 6 (six) hours as needed for wheezing or shortness of breath. 1 Inhaler 2  . amphetamine-dextroamphetamine (ADDERALL XR) 30 MG 24 hr capsule Take 1 capsule (30 mg total) by mouth daily. 30 capsule 0  . amphetamine-dextroamphetamine (ADDERALL XR) 30 MG 24 hr capsule Take 1 capsule (30 mg total) by mouth daily. 30 capsule 0  . amphetamine-dextroamphetamine (ADDERALL XR) 30 MG 24 hr capsule Take 1 capsule (30 mg total) by mouth daily. 30 capsule 0  . amphetamine-dextroamphetamine (ADDERALL) 20 MG tablet Take 1 tablet (20 mg total) by mouth daily. 30 tablet 0  . pantoprazole (PROTONIX) 40 MG tablet TAKE 1 TABLET BY MOUTH EVERY DAY 90 tablet 2   No current facility-administered medications for this visit.    Medication Side Effects: None  Allergies: No Known Allergies  Past Medical History:  Diagnosis Date  . ADD (attention deficit disorder)   . GERD (gastroesophageal reflux disease)     Family History  Problem Relation Age of Onset  . Arthritis  Maternal Grandmother   . Hypertension Maternal Grandfather   . Arthritis Maternal Grandfather     Social History   Socioeconomic History  . Marital status: Married    Spouse name: Not on file  . Number of children: Not on file  . Years of education: Not on file  . Highest education level: Not on file  Occupational History  . Not on file   Tobacco Use  . Smoking status: Former Research scientist (life sciences)  . Smokeless tobacco: Never Used  Substance and Sexual Activity  . Alcohol use: Yes    Alcohol/week: 0.0 standard drinks    Comment: occ  . Drug use: No  . Sexual activity: Yes    Partners: Female  Other Topics Concern  . 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 Difficulty of Paying Living Expenses:   Food Insecurity:   . Worried About Charity fundraiser in the Last Year:   . Arboriculturist in the Last Year:   Transportation Needs:   . Film/video editor (Medical):   Marland Kitchen Lack of Transportation (Non-Medical):   Physical Activity:   . Days of Exercise per Week:   . Minutes of Exercise per Session:   Stress:   . Feeling of Stress :   Social Connections:   . Frequency of Communication with Friends and Family:   . Frequency of Social Gatherings with Friends and Family:   . Attends Religious Services:   . Active Member of Clubs or Organizations:   . Attends Archivist Meetings:   Marland Kitchen Marital Status:   Intimate Partner Violence:   . Fear of Current or Ex-Partner:   . Emotionally Abused:   Marland Kitchen Physically Abused:   . Sexually Abused:     Past Medical History, Surgical history, Social history, and Family history were reviewed and updated as appropriate.   Please see review of systems for further details on the patient's review from today.   Objective:   Physical Exam:  There were no vitals taken for this visit.  Physical Exam Constitutional:      General: He is not in acute distress. Musculoskeletal:        General: No deformity.  Neurological:     Mental Status: He is alert and oriented to person, place, and time.     Coordination: Coordination normal.  Psychiatric:        Attention and Perception: Attention and perception normal. He does not perceive auditory  or visual hallucinations.        Mood and Affect: Mood normal. Mood is not anxious or depressed. Affect is not labile, blunt, angry or inappropriate.        Speech: Speech normal.        Behavior: Behavior normal.        Thought Content: Thought content normal. Thought content is not paranoid or delusional. Thought content does not include homicidal or suicidal ideation. Thought content does not include homicidal or suicidal plan.        Cognition and Memory: Cognition and memory normal.        Judgment: Judgment normal.     Comments: Insight intact     Lab Review:     Component Value Date/Time   NA 139 04/12/2019 0733   K 4.3  04/12/2019 0733   CL 100 04/12/2019 0733   CO2 30 04/12/2019 0733   GLUCOSE 79 04/12/2019 0733   BUN 9 04/12/2019 0733   CREATININE 0.81 04/12/2019 0733   CALCIUM 9.5 04/12/2019 0733   PROT 7.1 04/12/2019 0733   ALBUMIN 4.7 04/12/2019 0733   AST 17 04/12/2019 0733   ALT 12 04/12/2019 0733   ALKPHOS 72 04/12/2019 0733   BILITOT 1.0 04/12/2019 0733       Component Value Date/Time   WBC 9.3 04/12/2019 0733   RBC 5.11 04/12/2019 0733   HGB 15.6 04/12/2019 0733   HCT 45.5 04/12/2019 0733   PLT 217.0 04/12/2019 0733   MCV 89.0 04/12/2019 0733   MCHC 34.3 04/12/2019 0733   RDW 13.1 04/12/2019 0733   LYMPHSABS 1.9 04/12/2019 0733   MONOABS 0.7 04/12/2019 0733   EOSABS 0.4 04/12/2019 0733   BASOSABS 0.1 04/12/2019 0733    No results found for: POCLITH, LITHIUM   No results found for: PHENYTOIN, PHENOBARB, VALPROATE, CBMZ   .res Assessment: Plan:   Plan:  1. Adderall XR 30mg  BID  2. Adderall 20mg  daily - 1 script sent  RTC 3 months  Patient advised to contact office with any questions, adverse effects, or acute worsening in signs and symptoms.  Discussed potential benefits, risks, and side effects of stimulants with patient to include increased heart rate, palpitations, insomnia, increased anxiety, increased irritability, or decreased  appetite.  Instructed patient to contact office if experiencing any significant tolerability issues. There are no diagnoses linked to this encounter.  Please see After Visit Summary for patient specific instructions.  No future appointments.  No orders of the defined types were placed in this encounter.     -------------------------------

## 2019-12-21 ENCOUNTER — Other Ambulatory Visit: Payer: Self-pay | Admitting: Adult Health

## 2019-12-21 DIAGNOSIS — K2100 Gastro-esophageal reflux disease with esophagitis, without bleeding: Secondary | ICD-10-CM

## 2019-12-22 NOTE — Telephone Encounter (Signed)
DENIED.  SENT IN FOR 9 MONTHS ON 05/24/2019.

## 2019-12-27 ENCOUNTER — Ambulatory Visit
Admission: EM | Admit: 2019-12-27 | Discharge: 2019-12-27 | Disposition: A | Discharge status: Home / Self Care | Payer: BC Managed Care – PPO | Attending: Emergency Medicine | Admitting: Emergency Medicine

## 2019-12-27 ENCOUNTER — Encounter: Payer: Self-pay | Admitting: Emergency Medicine

## 2019-12-27 ENCOUNTER — Ambulatory Visit (INDEPENDENT_AMBULATORY_CARE_PROVIDER_SITE_OTHER)
Admission: RE | Admit: 2019-12-27 | Discharge: 2019-12-27 | Disposition: A | Discharge status: Home / Self Care | Payer: BC Managed Care – PPO | Source: Ambulatory Visit

## 2019-12-27 ENCOUNTER — Other Ambulatory Visit: Payer: Self-pay

## 2019-12-27 DIAGNOSIS — R5381 Other malaise: Secondary | ICD-10-CM

## 2019-12-27 DIAGNOSIS — R0981 Nasal congestion: Secondary | ICD-10-CM | POA: Diagnosis not present

## 2019-12-27 DIAGNOSIS — J302 Other seasonal allergic rhinitis: Secondary | ICD-10-CM | POA: Diagnosis not present

## 2019-12-27 DIAGNOSIS — R519 Headache, unspecified: Secondary | ICD-10-CM | POA: Diagnosis not present

## 2019-12-27 MED ORDER — FLUTICASONE PROPIONATE 50 MCG/ACT NA SUSP: 1.0000 | Freq: Every day | NASAL | 0 refills | Status: DC

## 2019-12-27 NOTE — ED Provider Notes (Signed)
Virtual Visit via Video Note:  [name]  initiated request for Telemedicine visit with [facility] team. I connected with Augustine Radar  on 12/27/2019 at 11:19 AM  for a synchronized telemedicine visit using a video enabled HIPPA compliant telemedicine application. I verified that I am speaking with Augustine Radar  using two identifiers. [place] Hall-Potvin, PA-C  was physically located in a [place] [facility] site and Tobyn Vlach was located at a different location.   The limitations of evaluation and management by telemedicine as well as the availability of in-person appointments were discussed. Patient was informed that he  may incur a bill ( including co-pay) for this virtual visit encounter. Augustine Radar  expressed understanding and gave verbal consent to proceed with virtual visit.     History of Present Illness:Riley Cardenas  is a 39 y.o. male presents with 1 week course of scratchy throat, nasal congestion, malaise, frontal headache, postnasal drip.  States he does have history of seasonal allergies: Using Xyzal, Flonase daily without significant relief.  Patient denies fever, cough, shortness of breath.  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  Patient is fully vaccinated for Covid: Completed immunization in early April.    ROI as per HPI  Past Medical History:  Diagnosis Date  . ADD (attention deficit disorder)   . GERD (gastroesophageal reflux disease)     No Known Allergies      Observations/Objective: 39 y.o. male sitting in no acute distress.  Patient is able to speak in full sentences without coughing, sneezing, wheezing.  Patient does sound mildly congested, otherwise appears well.  Assessment and Plan: H&P concerning for severe seasonal allergies vs viral URI (including breakthrough case of Covid) vs ABS.  Patient has had symptoms for about 1 week.  He is agreeable to presenting in office to obtain Covid sample.  If this is  negative, and patient is still having persistent symptoms, would consider antibiotics per ABS at that time.  Return precautions discussed, patient verbalized understanding and is agreeable to plan.  Follow Up Instructions: Patient to present to [facility] for Covid testing.   I discussed the assessment and treatment plan with the patient. The patient was provided an opportunity to ask questions and all were answered. The patient agreed with the plan and demonstrated an understanding of the instructions.   The patient was advised to call back or seek an in-person evaluation if the symptoms worsen or if the condition fails to improve as anticipated.  I provided 15 minutes of non-face-to-face time during this encounter.    [place], PA-C  12/27/2019 11:19 AM        Hall-Potvin, [place], PA-C 12/27/19 1119

## 2019-12-27 NOTE — ED Triage Notes (Signed)
Patient participated in virtual visit with provider.  Patient in department for covid test only

## 2019-12-27 NOTE — Discharge Instructions (Signed)
Covid test pending: Recommend you stay at home until your results are back. Continue Flonase: 2 sprays in each nostril once daily, daytime antihistamine. Important to push fluids: Drinking plenty of water throughout the day can help keep secretions thin. May take ibuprofen, Tylenol as needed for headache. Important to get plenty of sleep. If Covid test is negative, and you have reached 10 days of symptoms, would consider antibiotic at that time.

## 2019-12-28 LAB — SARS-COV-2, NAA 2 DAY TAT

## 2019-12-28 LAB — NOVEL CORONAVIRUS, NAA: SARS-CoV-2, NAA: NOT DETECTED

## 2019-12-29 ENCOUNTER — Encounter: Payer: Self-pay | Admitting: Adult Health

## 2020-02-26 ENCOUNTER — Telehealth: Payer: Self-pay | Admitting: Adult Health

## 2020-02-26 ENCOUNTER — Other Ambulatory Visit: Payer: Self-pay

## 2020-02-26 DIAGNOSIS — F909 Attention-deficit hyperactivity disorder, unspecified type: Secondary | ICD-10-CM

## 2020-02-26 MED ORDER — AMPHETAMINE-DEXTROAMPHET ER 30 MG PO CP24: 30.0000 mg | ORAL_CAPSULE | Freq: Every day | ORAL | 0 refills | Status: DC

## 2020-02-26 NOTE — Telephone Encounter (Signed)
Last refill 01/21/2020, pended for Rollene Fare to review and send to updated pharmacy

## 2020-02-26 NOTE — Telephone Encounter (Signed)
Pt called requesting refill for Adderall XR 30 mg @ [facility]  [address] [place].  Out of town until apt date 7/22.

## 2020-03-04 ENCOUNTER — Telehealth: Payer: Self-pay | Admitting: Adult Health

## 2020-03-04 DIAGNOSIS — K2100 Gastro-esophageal reflux disease with esophagitis, without bleeding: Secondary | ICD-10-CM

## 2020-03-04 NOTE — Telephone Encounter (Signed)
Pt is calling in stating that he is out of town and forgot his pantoprazole (PROTONIX) 40 MG and would like to see if some can be sent to the pharmacy down in [place].    [facility] [address], [place] [phone].

## 2020-03-05 MED ORDER — PANTOPRAZOLE SODIUM 40 MG PO TBEC: 40.0000 mg | DELAYED_RELEASE_TABLET | Freq: Every day | ORAL | 0 refills | Status: DC

## 2020-03-05 NOTE — Telephone Encounter (Signed)
Sent to the pharmacy by e-scribe for 30 days.

## 2020-03-07 ENCOUNTER — Ambulatory Visit (INDEPENDENT_AMBULATORY_CARE_PROVIDER_SITE_OTHER): Payer: BC Managed Care – PPO | Admitting: Adult Health

## 2020-03-07 ENCOUNTER — Ambulatory Visit: Payer: BC Managed Care – PPO | Admitting: Adult Health

## 2020-03-07 ENCOUNTER — Other Ambulatory Visit: Payer: Self-pay

## 2020-03-07 ENCOUNTER — Telehealth: Payer: Self-pay | Admitting: Adult Health

## 2020-03-07 ENCOUNTER — Other Ambulatory Visit: Payer: Self-pay | Admitting: Adult Health

## 2020-03-07 ENCOUNTER — Encounter: Payer: Self-pay | Admitting: Adult Health

## 2020-03-07 DIAGNOSIS — F909 Attention-deficit hyperactivity disorder, unspecified type: Secondary | ICD-10-CM | POA: Diagnosis not present

## 2020-03-07 MED ORDER — AMPHETAMINE-DEXTROAMPHET ER 30 MG PO CP24: 30.0000 mg | ORAL_CAPSULE | Freq: Every day | ORAL | 0 refills | Status: DC

## 2020-03-07 MED ORDER — AMPHETAMINE-DEXTROAMPHETAMINE 20 MG PO TABS
20.0000 mg | ORAL_TABLET | Freq: Every day | ORAL | 0 refills | Status: DC
Start: 2020-03-07 — End: 2020-06-06

## 2020-03-07 MED ORDER — AMPHETAMINE-DEXTROAMPHETAMINE 20 MG PO TABS: 20.0000 mg | ORAL_TABLET | Freq: Every day | ORAL | 0 refills | Status: DC

## 2020-03-07 NOTE — Progress Notes (Signed)
Riley Cardenas [id number] 19-Oct-1980 39 y.o.  Subjective:   Patient ID:  Riley Cardenas is a 39 y.o. (DOB Nov 30, 1980) male.  Chief Complaint: No chief complaint on file.   HPI Riley Cardenas presents to the office today for follow-up of ADHD.  Describes mood today as "ok". Pleasant. Mood symptoms - denies depression, anxiety, and irritability. Stating "I'm doing great". [place] 2 weeks at the [facility]. Family doing well. Stable interest and motivation. Taking medications as prescribed.  Energy levels stable. Active, has a regular exercise routine. Running.   Enjoys some usual interests and activities. Married. Lives with wife of 6 years and his 2 sons.Spending time with family.  Appetite adequate. Weight loss 30 pounds.  Sleep has improved. Averages 6 to 8 hours. Focus and concentration stable. Completing tasks. Managing aspects of household. 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 Denies AH or VH.   PHQ2-9     Office Visit from 04/12/2019 in [place] at [facility] Total Score 0       Review of Systems:  Review of Systems  Musculoskeletal: Negative for gait problem.  Neurological: Negative for tremors.  Psychiatric/Behavioral:       Please refer to HPI    Medications: I have reviewed the patient's current medications.  Current Outpatient Medications  Medication Sig Dispense Refill  . albuterol (PROVENTIL HFA;VENTOLIN HFA) 108 (90 Base) MCG/ACT inhaler Inhale 2 puffs into the lungs every 6 (six) hours as needed for wheezing or shortness of breath. 1 Inhaler 2  . amphetamine-dextroamphetamine (ADDERALL XR) 30 MG 24 hr capsule Take 1 capsule (30 mg total) by mouth daily. 30 capsule 0  . [START ON 04/04/2020] amphetamine-dextroamphetamine (ADDERALL XR) 30 MG 24 hr capsule Take 1 capsule (30 mg total) by mouth daily. 30 capsule 0  . amphetamine-dextroamphetamine (ADDERALL XR) 30 MG 24 hr capsule Take 1 capsule (30 mg total) by  mouth daily. 30 capsule 0  . [START ON 05/02/2020] amphetamine-dextroamphetamine (ADDERALL) 20 MG tablet Take 1 tablet (20 mg total) by mouth daily. 30 tablet 0  . fluticasone (FLONASE) 50 MCG/ACT nasal spray Place 1 spray into both nostrils daily. 16 g 0  . pantoprazole (PROTONIX) 40 MG tablet Take 1 tablet (40 mg total) by mouth daily. 30 tablet 0   No current facility-administered medications for this visit.    Medication Side Effects: None  Allergies: No Known Allergies  Past Medical History:  Diagnosis Date  . ADD (attention deficit disorder)   . GERD (gastroesophageal reflux disease)     Family History  Problem Relation Age of Onset  . Arthritis Maternal Grandmother   . Hypertension Maternal Grandfather   . Arthritis Maternal Grandfather     Social History   Socioeconomic History  . Marital status: Married    Spouse name: Not on file  . Number of children: Not on file  . Years of education: Not on file  . Highest education level: Not on file  Occupational History  . Not on file  Tobacco Use  . Smoking status: Former Research scientist (life sciences)  . Smokeless tobacco: Never Used  Substance and Sexual Activity  . Alcohol use: Yes    Alcohol/week: 0.0 standard drinks    Comment: occ  . Drug use: No  . Sexual activity: Yes    Partners: Female  Other Topics Concern  . 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  Married    22 month old son    Social Determinants of Radio broadcast assistant Strain:   . Difficulty of Paying Living Expenses:   Food Insecurity:   . Worried About Charity fundraiser in the Last Year:   . Arboriculturist in the Last Year:   Transportation Needs:   . Film/video editor (Medical):   Marland Kitchen Lack of Transportation (Non-Medical):   Physical Activity:   . Days of Exercise per Week:   . Minutes of Exercise per Session:   Stress:   . Feeling of Stress :   Social Connections:   . Frequency of  Communication with Friends and Family:   . Frequency of Social Gatherings with Friends and Family:   . Attends Religious Services:   . Active Member of Clubs or Organizations:   . Attends Archivist Meetings:   Marland Kitchen Marital Status:   Intimate Partner Violence:   . Fear of Current or Ex-Partner:   . Emotionally Abused:   Marland Kitchen Physically Abused:   . Sexually Abused:     Past Medical History, Surgical history, Social history, and Family history were reviewed and updated as appropriate.   Please see review of systems for further details on the patient's review from today.   Objective:   Physical Exam:  There were no vitals taken for this visit.  Physical Exam Constitutional:      General: He is not in acute distress. Musculoskeletal:        General: No deformity.  Neurological:     Mental Status: He is alert and oriented to person, place, and time.     Coordination: Coordination normal.  Psychiatric:        Attention and Perception: Attention and perception normal. He does not perceive auditory or visual hallucinations.        Mood and Affect: Mood normal. Mood is not anxious or depressed. Affect is not labile, blunt, angry or inappropriate.        Speech: Speech normal.        Behavior: Behavior normal.        Thought Content: Thought content normal. Thought content is not paranoid or delusional. Thought content does not include homicidal or suicidal ideation. Thought content does not include homicidal or suicidal plan.        Cognition and Memory: Cognition and memory normal.        Judgment: Judgment normal.     Comments: Insight intact     Lab Review:     Component Value Date/Time   NA 139 04/12/2019 0733   K 4.3 04/12/2019 0733   CL 100 04/12/2019 0733   CO2 30 04/12/2019 0733   GLUCOSE 79 04/12/2019 0733   BUN 9 04/12/2019 0733   CREATININE 0.81 04/12/2019 0733   CALCIUM 9.5 04/12/2019 0733   PROT 7.1 04/12/2019 0733   ALBUMIN 4.7 04/12/2019 0733   AST 17  04/12/2019 0733   ALT 12 04/12/2019 0733   ALKPHOS 72 04/12/2019 0733   BILITOT 1.0 04/12/2019 0733       Component Value Date/Time   WBC 9.3 04/12/2019 0733   RBC 5.11 04/12/2019 0733   HGB 15.6 04/12/2019 0733   HCT 45.5 04/12/2019 0733   PLT 217.0 04/12/2019 0733   MCV 89.0 04/12/2019 0733   MCHC 34.3 04/12/2019 0733   RDW 13.1 04/12/2019 0733   LYMPHSABS 1.9 04/12/2019 0733   MONOABS 0.7 04/12/2019 0733   EOSABS 0.4 04/12/2019  9977   BASOSABS 0.1 04/12/2019 0733    No results found for: POCLITH, LITHIUM   No results found for: PHENYTOIN, PHENOBARB, VALPROATE, CBMZ   .res Assessment: Plan:    Plan:  1. Adderall XR 30mg  BID  2. Adderall 20mg  daily - 1 script sent  115/69/71  RTC 3 months  Patient advised to contact office with any questions, adverse effects, or acute worsening in signs and symptoms.  Discussed potential benefits, risks, and side effects of stimulants with patient to include increased heart rate, palpitations, insomnia, increased anxiety, increased irritability, or decreased appetite.  Instructed patient to contact office if experiencing any significant tolerability issues. There are no diagnoses linked to this encounter.  Please see After Visit Summary for patient specific instructions.   Diagnoses and all orders for this visit:  Attention deficit hyperactivity disorder (ADHD), unspecified ADHD type -     amphetamine-dextroamphetamine (ADDERALL XR) 30 MG 24 hr capsule; Take 1 capsule (30 mg total) by mouth daily. -     Discontinue: amphetamine-dextroamphetamine (ADDERALL XR) 30 MG 24 hr capsule; Take 1 capsule (30 mg total) by mouth daily. -     amphetamine-dextroamphetamine (ADDERALL XR) 30 MG 24 hr capsule; Take 1 capsule (30 mg total) by mouth daily. -     amphetamine-dextroamphetamine (ADDERALL) 20 MG tablet; Take 1 tablet (20 mg total) by mouth daily. -     amphetamine-dextroamphetamine (ADDERALL XR) 30 MG 24 hr capsule; Take 1 capsule (30  mg total) by mouth daily.     Please see After Visit Summary for patient specific instructions.  Future Appointments  Date Time Provider [place]  04/12/2020  9:00 AM Dorothyann Peng, NP [facility]  06/06/2020  4:40 PM Chancie Lampert, Berdie Ogren, NP [facility] None    No orders of the defined types were placed in this encounter.   -------------------------------

## 2020-03-07 NOTE — Telephone Encounter (Signed)
Mung called because the pharmacy told him the prescription you sent in for his Adderall 20 mg had a fill date of 05/02/2020 not todays date.  Please call to correct the fill date for this prescription.  Also you sent 3 prescriptions for his Adderall 30mg .  2 had fill date of today and 1 has fill date 04/04/20.  One of them should be for September fill date.  Again please send corrected prescriptions.  [facility] [address] [facility] [address]

## 2020-03-07 NOTE — Telephone Encounter (Signed)
Corrected and resent

## 2020-03-27 ENCOUNTER — Other Ambulatory Visit: Payer: Self-pay | Admitting: Adult Health

## 2020-03-27 DIAGNOSIS — K2100 Gastro-esophageal reflux disease with esophagitis, without bleeding: Secondary | ICD-10-CM

## 2020-03-27 NOTE — Telephone Encounter (Signed)
SENT TO THE PHARMACY BY E-SCRIBE FOR 30 DAYS.  PT HAS UPCOMING CPX.

## 2020-04-10 ENCOUNTER — Other Ambulatory Visit: Payer: Self-pay | Admitting: Adult Health

## 2020-04-10 DIAGNOSIS — K2100 Gastro-esophageal reflux disease with esophagitis, without bleeding: Secondary | ICD-10-CM

## 2020-04-11 NOTE — Telephone Encounter (Signed)
Patient need to schedule an ov for more refills. 

## 2020-04-12 ENCOUNTER — Encounter: Payer: BC Managed Care – PPO | Admitting: Adult Health

## 2020-04-22 ENCOUNTER — Other Ambulatory Visit: Payer: Self-pay | Admitting: Adult Health

## 2020-04-22 DIAGNOSIS — K2100 Gastro-esophageal reflux disease with esophagitis, without bleeding: Secondary | ICD-10-CM

## 2020-06-06 ENCOUNTER — Other Ambulatory Visit: Payer: Self-pay

## 2020-06-06 ENCOUNTER — Encounter: Payer: Self-pay | Admitting: Adult Health

## 2020-06-06 ENCOUNTER — Ambulatory Visit (INDEPENDENT_AMBULATORY_CARE_PROVIDER_SITE_OTHER): Payer: BC Managed Care – PPO | Admitting: Adult Health

## 2020-06-06 DIAGNOSIS — F909 Attention-deficit hyperactivity disorder, unspecified type: Secondary | ICD-10-CM

## 2020-06-06 MED ORDER — AMPHETAMINE-DEXTROAMPHET ER 30 MG PO CP24: 30.0000 mg | ORAL_CAPSULE | Freq: Every day | ORAL | 0 refills | Status: DC

## 2020-06-06 MED ORDER — AMPHETAMINE-DEXTROAMPHETAMINE 20 MG PO TABS: 20.0000 mg | ORAL_TABLET | Freq: Every day | ORAL | 0 refills | Status: DC

## 2020-06-06 NOTE — Progress Notes (Signed)
Riley Cardenas [id number] [birth year] 39 y.o.  Subjective:   Patient ID:  Riley Cardenas is a 39 y.o. (DOB [birth year]) male.  Chief Complaint: No chief complaint on file.   HPI [name] presents to the office today for follow-up of ADHD.  Describes mood today as "ok". Pleasant. Mood symptoms - denies depression, anxiety, and irritability. Stating "I'm doing great". Recently returned from [place] the mountains and 2 weeks at the [facility]. Family doing well. Stable interest and motivation. Taking medications as prescribed.  Energy levels stable. Active, has a regular exercise routine. Running.   Enjoys some usual interests and activities. Married. Lives with wife of 6 years and his 2 sons.Spending time with family.  Appetite adequate. Weight loss 30 pounds.  Sleep has improved. Averages 6 to 8 hours. Focus and concentration stable. Completing tasks. Managing aspects of household. 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 Denies AH or VH.    PHQ2-9     Office Visit from 04/12/2019 in [place] at [facility] Total Score 0       Review of Systems:  Review of Systems  Musculoskeletal: Negative for gait problem.  Neurological: Negative for tremors.  Psychiatric/Behavioral:       Please refer to HPI    Medications: I have reviewed the patient's current medications.  Current Outpatient Medications  Medication Sig Dispense Refill  . albuterol (PROVENTIL HFA;VENTOLIN HFA) 108 (90 Base) MCG/ACT inhaler Inhale 2 puffs into the lungs every 6 (six) hours as needed for wheezing or shortness of breath. 1 Inhaler 2  . amphetamine-dextroamphetamine (ADDERALL XR) 30 MG 24 hr capsule Take 1 capsule (30 mg total) by mouth daily. 30 capsule 0  . [START ON 07/04/2020] amphetamine-dextroamphetamine (ADDERALL XR) 30 MG 24 hr capsule Take 1 capsule (30 mg total) by mouth daily. 30 capsule 0  . [START ON 08/01/2020] amphetamine-dextroamphetamine (ADDERALL XR) 30 MG 24 hr capsule Take 1  capsule (30 mg total) by mouth daily. 30 capsule 0  . amphetamine-dextroamphetamine (ADDERALL) 20 MG tablet Take 1 tablet (20 mg total) by mouth daily. 30 tablet 0  . fluticasone (FLONASE) 50 MCG/ACT nasal spray Place 1 spray into both nostrils daily. 16 g 0  . pantoprazole (PROTONIX) 40 MG tablet TAKE 1 TABLET BY MOUTH EVERY DAY 90 tablet 0   No current facility-administered medications for this visit.    Medication Side Effects: None  Allergies: No Known Allergies  Past Medical History:  Diagnosis Date  . ADD (attention deficit disorder)   . GERD (gastroesophageal reflux disease)     Family History  Problem Relation Age of Onset  . Arthritis Maternal Grandmother   . Hypertension Maternal Grandfather   . Arthritis Maternal Grandfather     Social History   Socioeconomic History  . Marital status: Married    Spouse name: Not on file  . Number of children: Not on file  . Years of education: Not on file  . Highest education level: Not on file  Occupational History  . Not on file  Tobacco Use  . Smoking status: Former Research scientist (life sciences)  . Smokeless tobacco: Never Used  Substance and Sexual Activity  . Alcohol use: Yes    Alcohol/week: 0.0 standard drinks    Comment: occ  . Drug use: No  . Sexual activity: Yes    Partners: Female  Other Topics Concern  . 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 Difficulty of Paying Living Expenses: Not on file  Food Insecurity:   . Worried About Charity fundraiser in the Last Year: Not on file  . Ran Out of Food in the Last Year: Not on file  Transportation Needs:   . Lack of Transportation (Medical): Not on file  . Lack of Transportation (Non-Medical): Not on file  Physical Activity:   . Days of Exercise per Week: Not on file  . Minutes of Exercise per Session: Not on file  Stress:   .  Feeling of Stress : Not on file  Social Connections:   . Frequency of Communication with Friends and Family: Not on file  . Frequency of Social Gatherings with Friends and Family: Not on file  . Attends Religious Services: Not on file  . Active Member of Clubs or Organizations: Not on file  . Attends Archivist Meetings: Not on file  . Marital Status: Not on file  Intimate Partner Violence:   . Fear of Current or Ex-Partner: Not on file  . Emotionally Abused: Not on file  . Physically Abused: Not on file  . Sexually Abused: Not on file    Past Medical History, Surgical history, Social history, and Family history were reviewed and updated as appropriate.   Please see review of systems for further details on the patient's review from today.   Objective:   Physical Exam:  There were no vitals taken for this visit.  Physical Exam Constitutional:      General: He is not in acute distress. Musculoskeletal:        General: No deformity.  Neurological:     Mental Status: He is alert and oriented to person, place, and time.     Coordination: Coordination normal.  Psychiatric:        Attention and Perception: Attention and perception normal. He does not perceive auditory or visual hallucinations.        Mood and Affect: Mood normal. Mood is not anxious or depressed. Affect is not labile, blunt, angry or inappropriate.        Speech: Speech normal.        Behavior: Behavior normal.        Thought Content: Thought content normal. Thought content is not paranoid or delusional. Thought content does not include homicidal or suicidal ideation. Thought content does not include homicidal or suicidal plan.        Cognition and Memory: Cognition and memory normal.        Judgment: Judgment normal.     Comments: Insight intact     Lab Review:     Component Value Date/Time   NA 139 04/12/2019 0733   K 4.3 04/12/2019 0733   CL 100 04/12/2019 0733   CO2 30 04/12/2019 0733    GLUCOSE 79 04/12/2019 0733   BUN 9 04/12/2019 0733   CREATININE 0.81 04/12/2019 0733   CALCIUM 9.5 04/12/2019 0733   PROT 7.1 04/12/2019 0733   ALBUMIN 4.7 04/12/2019 0733   AST 17 04/12/2019 0733   ALT 12 04/12/2019 0733   ALKPHOS 72 04/12/2019 0733   BILITOT 1.0 04/12/2019 0733       Component Value Date/Time   WBC 9.3 04/12/2019 0733   RBC 5.11 04/12/2019 0733   HGB 15.6 04/12/2019 0733   HCT 45.5 04/12/2019 0733   PLT 217.0 04/12/2019  0733   MCV 89.0 04/12/2019 0733   MCHC 34.3 04/12/2019 0733   RDW 13.1 04/12/2019 0733   LYMPHSABS 1.9 04/12/2019 0733   MONOABS 0.7 04/12/2019 0733   EOSABS 0.4 04/12/2019 0733   BASOSABS 0.1 04/12/2019 0733    No results found for: POCLITH, LITHIUM   No results found for: PHENYTOIN, PHENOBARB, VALPROATE, CBMZ   .res Assessment: Plan:    Plan:  1. Adderall XR 30mg  BID  2. Adderall 20mg  daily - 1 script sent  123/90/71  RTC 3 months  Patient advised to contact office with any questions, adverse effects, or acute worsening in signs and symptoms.  Discussed potential benefits, risks, and side effects of stimulants with patient to include increased heart rate, palpitations, insomnia, increased anxiety, increased irritability, or decreased appetite.  Instructed patient to contact office if experiencing any significant tolerability issues. There are no diagnoses linked to this encounter.    Diagnoses and all orders for this visit:  Attention deficit hyperactivity disorder (ADHD), unspecified ADHD type -     amphetamine-dextroamphetamine (ADDERALL XR) 30 MG 24 hr capsule; Take 1 capsule (30 mg total) by mouth daily. -     amphetamine-dextroamphetamine (ADDERALL XR) 30 MG 24 hr capsule; Take 1 capsule (30 mg total) by mouth daily. -     amphetamine-dextroamphetamine (ADDERALL XR) 30 MG 24 hr capsule; Take 1 capsule (30 mg total) by mouth daily. -     amphetamine-dextroamphetamine (ADDERALL) 20 MG tablet; Take 1 tablet (20 mg total)  by mouth daily.     Please see After Visit Summary for patient specific instructions.  No future appointments.  No orders of the defined types were placed in this encounter.   -------------------------------

## 2020-07-16 ENCOUNTER — Other Ambulatory Visit: Payer: Self-pay | Admitting: Adult Health

## 2020-07-16 DIAGNOSIS — K2100 Gastro-esophageal reflux disease with esophagitis, without bleeding: Secondary | ICD-10-CM

## 2020-08-03 ENCOUNTER — Other Ambulatory Visit: Payer: Self-pay | Admitting: Adult Health

## 2020-08-03 DIAGNOSIS — K2100 Gastro-esophageal reflux disease with esophagitis, without bleeding: Secondary | ICD-10-CM

## 2020-09-05 ENCOUNTER — Telehealth: Payer: Self-pay | Admitting: Adult Health

## 2020-09-05 ENCOUNTER — Ambulatory Visit: Payer: BC Managed Care – PPO | Admitting: Adult Health

## 2020-09-05 NOTE — Telephone Encounter (Signed)
PT RS appt to 2/17. Will need Adderal XR 30mg  refilled until then. [facility] [address] [facility] [address]

## 2020-09-06 ENCOUNTER — Other Ambulatory Visit: Payer: Self-pay | Admitting: Adult Health

## 2020-09-06 DIAGNOSIS — F909 Attention-deficit hyperactivity disorder, unspecified type: Secondary | ICD-10-CM

## 2020-09-06 MED ORDER — AMPHETAMINE-DEXTROAMPHET ER 30 MG PO CP24: 30.0000 mg | ORAL_CAPSULE | Freq: Every day | ORAL | 0 refills | Status: DC

## 2020-09-06 NOTE — Telephone Encounter (Signed)
Script sent  

## 2020-10-03 ENCOUNTER — Other Ambulatory Visit: Payer: Self-pay

## 2020-10-03 ENCOUNTER — Ambulatory Visit (INDEPENDENT_AMBULATORY_CARE_PROVIDER_SITE_OTHER): Payer: BC Managed Care – PPO | Admitting: Adult Health

## 2020-10-03 ENCOUNTER — Encounter: Payer: Self-pay | Admitting: Adult Health

## 2020-10-03 DIAGNOSIS — F909 Attention-deficit hyperactivity disorder, unspecified type: Secondary | ICD-10-CM

## 2020-10-03 MED ORDER — AMPHETAMINE-DEXTROAMPHET ER 30 MG PO CP24: 30.0000 mg | ORAL_CAPSULE | Freq: Every day | ORAL | 0 refills | Status: DC

## 2020-10-03 MED ORDER — AMPHETAMINE-DEXTROAMPHETAMINE 20 MG PO TABS: 20.0000 mg | ORAL_TABLET | Freq: Every day | ORAL | 0 refills | Status: DC

## 2020-10-03 NOTE — Progress Notes (Signed)
Riley Cardenas [id number] 30-Mar-1981 40 y.o.  Subjective:   Patient ID:  Riley Cardenas is a 40 y.o. (DOB 10-Dec-1980) male.  Chief Complaint: No chief complaint on file.   HPI Riley Cardenas presents to the office today for follow-up of ADHD.  Describes mood today as "ok". Pleasant. Mood symptoms - denies depression, anxiety, and irritability. Stating "standard mood, I'm doing ok". Work stressors. Family doing well. Stable interest and motivation. Taking medications as prescribed.  Energy levels stable. Active, has a regular exercise routine. Running.   Enjoys some usual interests and activities. Married. Lives with wife of 6 years and his 2 sons.Spending time with family.  Appetite adequate. Weight 182.4 pounds. Sleeps better some nights than others. Averages 6 to 8 hours. Focus and concentration stable. Completing tasks. Managing aspects of household. 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  Denies AH or VH.    [place] Office Visit from 04/12/2019 in [place] at [facility] Total Score 0       Review of Systems:  Review of Systems  Musculoskeletal: Negative for gait problem.  Neurological: Negative for tremors.  Psychiatric/Behavioral:       Please refer to HPI    Medications: I have reviewed the patient's current medications.  Current Outpatient Medications  Medication Sig Dispense Refill  . albuterol (PROVENTIL HFA;VENTOLIN HFA) 108 (90 Base) MCG/ACT inhaler Inhale 2 puffs into the lungs every 6 (six) hours as needed for wheezing or shortness of breath. 1 Inhaler 2  . amphetamine-dextroamphetamine (ADDERALL XR) 30 MG 24 hr capsule Take 1 capsule (30 mg total) by mouth daily. 30 capsule 0  . amphetamine-dextroamphetamine (ADDERALL XR) 30 MG 24 hr capsule Take 1 capsule (30 mg total) by mouth daily. 30 capsule 0  . [START ON 10/31/2020] amphetamine-dextroamphetamine (ADDERALL XR) 30 MG 24 hr capsule Take 1 capsule (30 mg total) by mouth daily. 30  capsule 0  . [START ON 11/28/2020] amphetamine-dextroamphetamine (ADDERALL) 20 MG tablet Take 1 tablet (20 mg total) by mouth daily. 30 tablet 0  . fluticasone (FLONASE) 50 MCG/ACT nasal spray Place 1 spray into both nostrils daily. 16 g 0  . pantoprazole (PROTONIX) 40 MG tablet TAKE 1 TABLET BY MOUTH EVERY DAY 90 tablet 0   No current facility-administered medications for this visit.    Medication Side Effects: None  Allergies: No Known Allergies  Past Medical History:  Diagnosis Date  . ADD (attention deficit disorder)   . GERD (gastroesophageal reflux disease)     Family History  Problem Relation Age of Onset  . Arthritis Maternal Grandmother   . Hypertension Maternal Grandfather   . Arthritis Maternal Grandfather     Social History   Socioeconomic History  . Marital status: Married    Spouse name: Not on file  . Number of children: Not on file  . Years of education: Not on file  . Highest education level: Not on file  Occupational History  . Not on file  Tobacco Use  . Smoking status: Former Research scientist (life sciences)  . Smokeless tobacco: Never Used  Substance and Sexual Activity  . Alcohol use: Yes    Alcohol/week: 0.0 standard drinks    Comment: occ  . Drug use: No  . Sexual activity: Yes    Partners: Female  Other Topics Concern  . 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   Please see review of systems for further details on the patient's review from today.   Objective:   Physical Exam:  There were no  vitals taken for this visit.  Physical Exam Constitutional:      General: He is not in acute distress. Musculoskeletal:        General: No deformity.  Neurological:     Mental Status: He is alert and oriented to person, place, and time.     Coordination: Coordination normal.  Psychiatric:        Attention and Perception: Attention and perception normal. He does not perceive auditory or visual hallucinations.        Mood and Affect: Mood normal. Mood is not anxious or depressed. Affect is not labile, blunt, angry or inappropriate.        Speech: Speech normal.        Behavior: Behavior normal.        Thought Content: Thought content normal. Thought content is not paranoid or delusional. Thought content does not include homicidal or suicidal ideation. Thought content does not include homicidal or suicidal plan.        Cognition and Memory: Cognition and memory normal.        Judgment: Judgment normal.     Comments: Insight intact     Lab Review:     Component Value Date/Time   NA 139 04/12/2019 0733   K 4.3 04/12/2019 0733   CL 100 04/12/2019 0733   CO2 30 04/12/2019 0733   GLUCOSE 79 04/12/2019 0733   BUN 9 04/12/2019 0733   CREATININE 0.81 04/12/2019 0733   CALCIUM 9.5 04/12/2019 0733   PROT 7.1 04/12/2019 0733   ALBUMIN 4.7 04/12/2019 0733   AST 17 04/12/2019 0733   ALT 12 04/12/2019 0733   ALKPHOS 72 04/12/2019 0733   BILITOT 1.0 04/12/2019 0733       Component Value Date/Time   WBC 9.3 04/12/2019 0733   RBC 5.11 04/12/2019 0733   HGB 15.6 04/12/2019 0733   HCT 45.5 04/12/2019 0733   PLT 217.0 04/12/2019 0733   MCV 89.0 04/12/2019 0733   MCHC 34.3 04/12/2019 0733   RDW 13.1 04/12/2019 0733   LYMPHSABS 1.9 04/12/2019 0733   MONOABS 0.7 04/12/2019 0733   EOSABS 0.4 04/12/2019 0733   BASOSABS 0.1 04/12/2019 0733    No results found for: POCLITH, LITHIUM   No results found for: PHENYTOIN, PHENOBARB, VALPROATE, CBMZ   .res Assessment: Plan:    Plan:  1.  Adderall XR 30mg  BID  2. Adderall 20mg  daily - 1 script sent  BP 129/83 - 69  RTC 3 months  Patient advised to contact office with any questions, adverse effects, or acute worsening in signs and symptoms.  Discussed potential benefits, risks, and side effects of stimulants with patient to include increased heart rate, palpitations, insomnia, increased anxiety, increased irritability, or decreased appetite.  Instructed patient to contact office if experiencing any significant tolerability issues. There are no diagnoses linked to this encounter.    Diagnoses and all orders for this visit:  Attention deficit hyperactivity disorder (ADHD), unspecified ADHD type -     amphetamine-dextroamphetamine (ADDERALL XR) 30 MG 24 hr capsule; Take 1 capsule (30 mg total) by mouth daily. -     amphetamine-dextroamphetamine (ADDERALL XR) 30 MG 24 hr capsule; Take 1 capsule (30 mg total) by mouth daily. -     amphetamine-dextroamphetamine (ADDERALL XR) 30 MG 24 hr capsule; Take 1 capsule (30 mg total) by mouth daily. -     amphetamine-dextroamphetamine (ADDERALL) 20 MG tablet; Take 1 tablet (20 mg total) by mouth daily.     Please see After Visit Summary for patient specific instructions.  No future appointments.  No orders of the defined types were placed in this encounter.   -------------------------------

## 2020-12-10 ENCOUNTER — Other Ambulatory Visit: Payer: Self-pay | Admitting: Adult Health

## 2020-12-10 DIAGNOSIS — K2100 Gastro-esophageal reflux disease with esophagitis, without bleeding: Secondary | ICD-10-CM

## 2021-01-02 ENCOUNTER — Encounter: Payer: Self-pay | Admitting: Adult Health

## 2021-01-02 ENCOUNTER — Telehealth (INDEPENDENT_AMBULATORY_CARE_PROVIDER_SITE_OTHER): Payer: BC Managed Care – PPO | Admitting: Adult Health

## 2021-01-02 DIAGNOSIS — F909 Attention-deficit hyperactivity disorder, unspecified type: Secondary | ICD-10-CM

## 2021-01-02 MED ORDER — AMPHETAMINE-DEXTROAMPHET ER 30 MG PO CP24: 30.0000 mg | ORAL_CAPSULE | Freq: Every day | ORAL | 0 refills | Status: DC

## 2021-01-02 NOTE — Progress Notes (Signed)
Riley Cardenas [id number] [birth year] 40 y.o.  Virtual Visit via Video Note  I connected with pt @ on 01/02/21 at  8:00 AM EDT by a video enabled telemedicine application and verified that I am speaking with the correct person using two identifiers.   I discussed the limitations of evaluation and management by telemedicine and the availability of in person appointments. The patient expressed understanding and agreed to proceed.  I discussed the assessment and treatment plan with the patient. The patient was provided an opportunity to ask questions and all were answered. The patient agreed with the plan and demonstrated an understanding of the instructions.   The patient was advised to call back or seek an in-person evaluation if the symptoms worsen or if the condition fails to improve as anticipated.  I provided 10 minutes of non-face-to-face time during this encounter.  The patient was located at home.  The provider was located at [place].   Aloha Gell, NP   Subjective:   Patient ID:  Riley Cardenas is a 40 y.o. (DOB [birth year]) male.  Chief Complaint: No chief complaint on file.   HPI Riley Cardenas presents for follow-up of ADHD.  Describes mood today as "ok". Pleasant. Mood symptoms - denies depression, anxiety, and irritability. Stating "I'm doing ok". Wife recovering from Covid. Looking forward to summer vacation. He and family spending time at the [place]. Stable interest and motivation. Taking medications as prescribed.  Energy levels stable. Active, has a regular exercise routine. Running.   Enjoys some usual interests and activities. Married. Lives with wife his 2 sons. Spending time with family.  Appetite adequate. Weight 182.4 pounds. Sleeps better some nights than others. Averages 6 to 8 hours. Focus and concentration stable. Completing tasks. Managing aspects of household. 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  Denies AH or VH.   Review  of Systems:  Review of Systems  Musculoskeletal: Negative for gait problem.  Neurological: Negative for tremors.  Psychiatric/Behavioral:       Please refer to HPI    Medications: I have reviewed the patient's current medications.  Current Outpatient Medications  Medication Sig Dispense Refill  . albuterol (PROVENTIL HFA;VENTOLIN HFA) 108 (90 Base) MCG/ACT inhaler Inhale 2 puffs into the lungs every 6 (six) hours as needed for wheezing or shortness of breath. 1 Inhaler 2  . amphetamine-dextroamphetamine (ADDERALL XR) 30 MG 24 hr capsule Take 1 capsule (30 mg total) by mouth daily. 30 capsule 0  . [START ON 01/30/2021] amphetamine-dextroamphetamine (ADDERALL XR) 30 MG 24 hr capsule Take 1 capsule (30 mg total) by mouth daily. 30 capsule 0  . [START ON 02/27/2021] amphetamine-dextroamphetamine (ADDERALL XR) 30 MG 24 hr capsule Take 1 capsule (30 mg total) by mouth daily. 30 capsule 0  . amphetamine-dextroamphetamine (ADDERALL) 20 MG tablet Take 1 tablet (20 mg total) by mouth daily. 30 tablet 0  . fluticasone (FLONASE) 50 MCG/ACT nasal spray Place 1 spray into both nostrils daily. 16 g 0  . pantoprazole (PROTONIX) 40 MG tablet TAKE 1 TABLET BY MOUTH EVERY DAY 90 tablet 0   No current facility-administered medications for this visit.    Medication Side Effects: None  Allergies: No Known Allergies  Past Medical History:  Diagnosis Date  . ADD (attention deficit disorder)   . GERD (gastroesophageal reflux disease)     Family History  Problem Relation Age of Onset  . Arthritis Maternal Grandmother   . Hypertension Maternal Grandfather   . Arthritis Maternal Grandfather  Social History   Socioeconomic History  . Marital status: Married    Spouse name: Not on file  . Number of children: Not on file  . Years of education: Not on file  . Highest education level: Not on file  Occupational History  . Not on file  Tobacco Use  . Smoking status: Former Research scientist (life sciences)  . Smokeless  tobacco: Never Used  Substance and Sexual Activity  . Alcohol use: Yes    Alcohol/week: 0.0 standard drinks    Comment: occ  . Drug use: No  . Sexual activity: Yes    Partners: Female  Other Topics Concern  . 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   Please see review of systems for further details on the patient's review from today.   Objective:   Physical Exam:  There were no vitals taken for this visit.  Physical Exam Constitutional:      General: He is not in acute distress. Musculoskeletal:        General: No deformity.  Neurological:     Mental Status: He is alert and oriented to person, place, and time.     Coordination: Coordination normal.  Psychiatric:        Attention and Perception: Attention and perception normal. He does not perceive auditory or visual hallucinations.        Mood and Affect: Mood normal. Mood is not anxious or depressed. Affect is not labile, blunt, angry or inappropriate.        Speech: Speech normal.        Behavior: Behavior normal.        Thought Content: Thought content normal. Thought content is not paranoid or delusional. Thought content does not include homicidal or suicidal ideation. Thought content does not include homicidal or suicidal plan.        Cognition and Memory: Cognition and memory normal.        Judgment: Judgment normal.     Comments: Insight intact     Lab Review:     Component Value Date/Time   NA 139 04/12/2019 0733   K 4.3 04/12/2019 0733   CL  100 04/12/2019 0733   CO2 30 04/12/2019 0733   GLUCOSE 79 04/12/2019 0733   BUN 9 04/12/2019 0733   CREATININE 0.81 04/12/2019 0733   CALCIUM 9.5 04/12/2019 0733   PROT 7.1 04/12/2019 0733   ALBUMIN 4.7 04/12/2019 0733   AST 17 04/12/2019 0733   ALT 12 04/12/2019 0733   ALKPHOS 72 04/12/2019 0733   BILITOT 1.0 04/12/2019 0733       Component Value Date/Time   WBC 9.3 04/12/2019 0733   RBC 5.11 04/12/2019 0733   HGB 15.6 04/12/2019 0733   HCT 45.5 04/12/2019 0733   PLT 217.0 04/12/2019 0733   MCV 89.0 04/12/2019 0733   MCHC 34.3 04/12/2019 0733   RDW 13.1 04/12/2019 0733   LYMPHSABS 1.9 04/12/2019 0733   MONOABS 0.7 04/12/2019 7425  EOSABS 0.4 04/12/2019 0733   BASOSABS 0.1 04/12/2019 0733    No results found for: POCLITH, LITHIUM   No results found for: PHENYTOIN, PHENOBARB, VALPROATE, CBMZ   .res Assessment: Plan:    Plan:  1. Adderall XR 30mg  BID  2. Adderall 20mg  daily - 1 script sent  RTC 3 months  Patient advised to contact office with any questions, adverse effects, or acute worsening in signs and symptoms.  Discussed potential benefits, risks, and side effects of stimulants with patient to include increased heart rate, palpitations, insomnia, increased anxiety, increased irritability, or decreased appetite.  Instructed patient to contact office if experiencing any significant tolerability issues. There are no diagnoses linked to this encounter.    Diagnoses and all orders for this visit:  Attention deficit hyperactivity disorder (ADHD), unspecified ADHD type -     amphetamine-dextroamphetamine (ADDERALL XR) 30 MG 24 hr capsule; Take 1 capsule (30 mg total) by mouth daily. -     amphetamine-dextroamphetamine (ADDERALL XR) 30 MG 24 hr capsule; Take 1 capsule (30 mg total) by mouth daily. -     amphetamine-dextroamphetamine (ADDERALL XR) 30 MG 24 hr capsule; Take 1 capsule (30 mg total) by mouth daily.     Please see After Visit Summary for patient  specific instructions.  No future appointments.  No orders of the defined types were placed in this encounter.     -------------------------------

## 2021-01-12 ENCOUNTER — Other Ambulatory Visit: Payer: Self-pay | Admitting: Adult Health

## 2021-01-12 DIAGNOSIS — K2100 Gastro-esophageal reflux disease with esophagitis, without bleeding: Secondary | ICD-10-CM

## 2021-01-14 ENCOUNTER — Other Ambulatory Visit: Payer: Self-pay | Admitting: Adult Health

## 2021-01-14 DIAGNOSIS — K2100 Gastro-esophageal reflux disease with esophagitis, without bleeding: Secondary | ICD-10-CM

## 2021-01-21 ENCOUNTER — Other Ambulatory Visit: Payer: Self-pay

## 2021-01-22 ENCOUNTER — Encounter: Payer: Self-pay | Admitting: Adult Health

## 2021-01-22 ENCOUNTER — Ambulatory Visit (INDEPENDENT_AMBULATORY_CARE_PROVIDER_SITE_OTHER): Payer: BC Managed Care – PPO | Admitting: Adult Health

## 2021-01-22 VITALS — BP 100/80 | HR 75 | Temp 97.7°F | Ht 70.0 in | Wt 183.0 lb

## 2021-01-22 DIAGNOSIS — K2100 Gastro-esophageal reflux disease with esophagitis, without bleeding: Secondary | ICD-10-CM

## 2021-01-22 DIAGNOSIS — Z1159 Encounter for screening for other viral diseases: Secondary | ICD-10-CM

## 2021-01-22 DIAGNOSIS — F909 Attention-deficit hyperactivity disorder, unspecified type: Secondary | ICD-10-CM | POA: Diagnosis not present

## 2021-01-22 DIAGNOSIS — Z0000 Encounter for general adult medical examination without abnormal findings: Secondary | ICD-10-CM | POA: Diagnosis not present

## 2021-01-22 LAB — COMPREHENSIVE METABOLIC PANEL
ALT: 12 U/L (ref 0–53)
AST: 20 U/L (ref 0–37)
Albumin: 4.3 g/dL (ref 3.5–5.2)
Alkaline Phosphatase: 51 U/L (ref 39–117)
BUN: 13 mg/dL (ref 6–23)
CO2: 28 mEq/L (ref 19–32)
Calcium: 9.3 mg/dL (ref 8.4–10.5)
Chloride: 103 mEq/L (ref 96–112)
Creatinine, Ser: 0.9 mg/dL (ref 0.40–1.50)
GFR: 106.53 mL/min (ref >60.00)
Glucose, Bld: 82 mg/dL (ref 70–99)
Potassium: 3.8 mEq/L (ref 3.5–5.1)
Sodium: 139 mEq/L (ref 135–145)
Total Bilirubin: 1.4 mg/dL — ABNORMAL HIGH (ref 0.2–1.2)
Total Protein: 6.6 g/dL (ref 6.0–8.3)

## 2021-01-22 LAB — CBC WITH DIFFERENTIAL/PLATELET
Basophils Absolute: 0.1 10*3/uL (ref 0.0–0.1)
Basophils Relative: 1.2 % (ref 0.0–3.0)
Eosinophils Absolute: 0.2 10*3/uL (ref 0.0–0.7)
Eosinophils Relative: 3.7 % (ref 0.0–5.0)
HCT: 40.6 % (ref 39.0–52.0)
Hemoglobin: 14.2 g/dL (ref 13.0–17.0)
Lymphocytes Relative: 33.5 % (ref 12.0–46.0)
Lymphs Abs: 1.8 10*3/uL (ref 0.7–4.0)
MCHC: 34.9 g/dL (ref 30.0–36.0)
MCV: 88.4 fl (ref 78.0–100.0)
Monocytes Absolute: 0.4 10*3/uL (ref 0.1–1.0)
Monocytes Relative: 6.8 % (ref 3.0–12.0)
Neutro Abs: 3 10*3/uL (ref 1.4–7.7)
Neutrophils Relative %: 54.8 % (ref 43.0–77.0)
Platelets: 209 10*3/uL (ref 150.0–400.0)
RBC: 4.59 Mil/uL (ref 4.22–5.81)
RDW: 13.6 % (ref 11.5–15.5)
WBC: 5.5 10*3/uL (ref 4.0–10.5)

## 2021-01-22 LAB — LIPID PANEL
Cholesterol: 160 mg/dL (ref 0–200)
HDL: 45.4 mg/dL (ref >39.00)
LDL Cholesterol: 104 mg/dL — ABNORMAL HIGH (ref 0–99)
NonHDL: 114.67
Total CHOL/HDL Ratio: 4
Triglycerides: 52 mg/dL (ref 0.0–149.0)
VLDL: 10.4 mg/dL (ref 0.0–40.0)

## 2021-01-22 LAB — TSH: TSH: 1.77 u[IU]/mL (ref 0.35–4.50)

## 2021-01-22 MED ORDER — PANTOPRAZOLE SODIUM 40 MG PO TBEC: 1.0000 | DELAYED_RELEASE_TABLET | Freq: Every day | ORAL | 3 refills | Status: DC

## 2021-01-22 NOTE — Patient Instructions (Signed)
Health Maintenance Due  Topic Date Due  . COVID-19 Vaccine (1) Never done  . Hepatitis C Screening  Never done    Depression screen [facility] 2/9 04/12/2019  Decreased Interest 0  Down, Depressed, Hopeless 0  PHQ - 2 Score 0

## 2021-01-22 NOTE — Progress Notes (Signed)
Subjective:    Patient ID: Riley Cardenas, male    DOB: 1981/05/06, 40 y.o.   MRN: [id number]  HPI Patient presents for yearly preventative medicine examination. He is a pleasant 40 year old male who  has a past medical history of ADD (attention deficit disorder) and GERD (gastroesophageal reflux disease).  GERD - Takes protonix 40 mg nightly. Reports that this works pretty well for him but he continues to have a cough and once or twice every few weeks he will have more significant acid reflux like symptoms ( vomiting and burning sensation) . He has tried coming off Protonix in the past and symptoms were significant.   ADHD - takes Adderall 30 mg XR daily. Prescribed by [facility]. Feels as though this works well for him.   All immunizations and health maintenance protocols were reviewed with the patient and needed orders were placed.  Appropriate screening laboratory values were ordered for the patient including screening of hyperlipidemia, renal function and hepatic function.  Medication reconciliation,  past medical history, social history, problem list and allergies were reviewed in detail with the patient  Goals were established with regard to weight loss, exercise, and  diet in compliance with medications  Wt Readings from Last 3 Encounters:  01/22/21 183 lb (83 kg)  04/12/19 191 lb (86.6 kg)  08/18/18 185 lb 9.6 oz (84.2 kg)    Review of Systems  Constitutional: Negative.   HENT: Negative.   Eyes: Negative.   Respiratory: Negative.   Cardiovascular: Negative.   Gastrointestinal: Negative.   Endocrine: Negative.   Genitourinary: Negative.   Musculoskeletal: Negative.   Skin: Negative.   Allergic/Immunologic: Negative.   Neurological: Negative.   Hematological: Negative.   Psychiatric/Behavioral: Negative.   All other systems reviewed and are negative.  Past Medical History:  Diagnosis Date  . ADD (attention deficit disorder)   . GERD  (gastroesophageal reflux disease)     Social History   Socioeconomic History  . Marital status: Married    Spouse name: Not on file  . Number of children: Not on file  . Years of education: Not on file  . Highest education level: Not on file  Occupational History  . Not on file  Tobacco Use  . Smoking status: Former Research scientist (life sciences)  . Smokeless tobacco: Never Used  Substance and Sexual Activity  . Alcohol use: Yes    Alcohol/week: 0.0 standard drinks    Comment: occ  . Drug use: No  . Sexual activity: Yes    Partners: Female  Other Topics Concern  . 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 No pertinent surgical history.  Family History  Problem Relation Age of Onset  . Arthritis Maternal Grandmother   . Hypertension Maternal Grandfather   . Arthritis Maternal Grandfather     No Known Allergies  Current Outpatient Medications on File Prior to Visit  Medication Sig Dispense Refill  . albuterol (PROVENTIL HFA;VENTOLIN HFA) 108 (90 Base) MCG/ACT inhaler Inhale 2 puffs into the lungs every 6 (six) hours as needed  for wheezing or shortness of breath. 1 Inhaler 2  . amphetamine-dextroamphetamine (ADDERALL XR) 30 MG 24 hr capsule Take 1 capsule (30 mg total) by mouth daily. 30 capsule 0  . [START ON 01/30/2021] amphetamine-dextroamphetamine (ADDERALL XR) 30 MG 24 hr capsule Take 1 capsule (30 mg total) by mouth daily. 30 capsule 0  . [START ON 02/27/2021] amphetamine-dextroamphetamine (ADDERALL XR) 30 MG 24 hr capsule Take 1 capsule (30 mg total) by mouth daily. 30 capsule 0  . amphetamine-dextroamphetamine (ADDERALL)  20 MG tablet Take 1 tablet (20 mg total) by mouth daily. 30 tablet 0  . pantoprazole (PROTONIX) 40 MG tablet TAKE 1 TABLET BY MOUTH EVERY DAY 90 tablet 0   No current facility-administered medications on file prior to visit.    BP 100/80   Pulse 75   Temp 97.7 F (36.5 C) (Oral)   Ht 5\' 10"  (1.778 m)   Wt 183 lb (83 kg)   SpO2 97%   BMI 26.26 kg/m       Objective:   Physical Exam Vitals and nursing note reviewed.  Constitutional:      General: He is not in acute distress.    Appearance: Normal appearance. He is well-developed and normal weight.  HENT:     Head: Normocephalic and atraumatic.     Right Ear: Tympanic membrane, ear canal and external ear normal. There is no impacted cerumen.     Left Ear: Tympanic membrane, ear canal and external ear normal. There is no impacted cerumen.     Nose: Nose normal. No congestion or rhinorrhea.     Mouth/Throat:     Mouth: Mucous membranes are moist.     Pharynx: Oropharynx is clear. No oropharyngeal exudate or posterior oropharyngeal erythema.  Eyes:     General:        Right eye: No discharge.        Left eye: No discharge.     Extraocular Movements: Extraocular movements intact.     Conjunctiva/sclera: Conjunctivae normal.     Pupils: Pupils are equal, round, and reactive to light.  Neck:     Vascular: No carotid bruit.     Trachea: No tracheal deviation.  Cardiovascular:     Rate and Rhythm: Normal rate and regular rhythm.     Pulses: Normal pulses.     Heart sounds: Normal heart sounds. No murmur heard. No friction rub. No gallop.   Pulmonary:     Effort: Pulmonary effort is normal. No respiratory distress.     Breath sounds: Normal breath sounds. No stridor. No wheezing, rhonchi or rales.  Chest:     Chest wall: No tenderness.  Abdominal:     General: Bowel sounds are normal. There is no distension.     Palpations: Abdomen is soft. There is no mass.     Tenderness: There is no abdominal tenderness. There is no right  CVA tenderness, left CVA tenderness, guarding or rebound.     Hernia: No hernia is present.  Musculoskeletal:        General: No swelling, tenderness, deformity or signs of injury. Normal range of motion.     Right lower leg: No edema.     Left lower leg: No edema.  Lymphadenopathy:     Cervical: No cervical adenopathy.  Skin:    General: Skin is warm and dry.     Capillary Refill: Capillary refill takes less than 2 seconds.     Coloration: Skin is not jaundiced or pale.  Findings: No bruising, erythema, lesion or rash.  Neurological:     General: No focal deficit present.     Mental Status: He is alert and oriented to person, place, and time.     Cranial Nerves: No cranial nerve deficit.     Sensory: No sensory deficit.     Motor: No weakness.     Coordination: Coordination normal.     Gait: Gait normal.     Deep Tendon Reflexes: Reflexes normal.  Psychiatric:        Mood and Affect: Mood normal.        Behavior: Behavior normal.        Thought Content: Thought content normal.        Judgment: Judgment normal.       Assessment & Plan:  1. Routine general medical examination at a health care facility - Healthy 41 year old male - Follow up in one year or sooner if needed - CBC with Differential/Platelet; Future - Comprehensive metabolic panel; Future - Lipid panel; Future - TSH; Future  2. Gastroesophageal reflux disease with esophagitis without hemorrhage - Is interested in seeing GI for further evaluation of GI issues  - pantoprazole (PROTONIX) 40 MG tablet; Take 1 tablet (40 mg total) by mouth daily.  Dispense: 90 tablet; Refill: 3 - Ambulatory referral to Gastroenterology  3. Attention deficit hyperactivity disorder (ADHD), unspecified ADHD type - Follow up with psychiatry as directed  4. Need for hepatitis C screening test  - Hep C Antibody; Future   Riley Peng, NP

## 2021-01-22 NOTE — Addendum Note (Signed)
Addended by: Elmer Picker on: 01/22/2021 07:34 AM   Modules accepted: Orders

## 2021-01-23 LAB — HEPATITIS C ANTIBODY
Hepatitis C Ab: NONREACTIVE
SIGNAL TO CUT-OFF: 0.01 (ref <1.00)

## 2021-01-31 ENCOUNTER — Encounter: Payer: Self-pay | Admitting: Gastroenterology

## 2021-02-06 ENCOUNTER — Encounter: Payer: Self-pay | Admitting: Adult Health

## 2021-02-25 ENCOUNTER — Encounter: Payer: Self-pay | Admitting: Gastroenterology

## 2021-02-25 ENCOUNTER — Telehealth: Payer: Self-pay | Admitting: *Deleted

## 2021-02-25 ENCOUNTER — Ambulatory Visit: Payer: BC Managed Care – PPO | Admitting: Gastroenterology

## 2021-02-25 VITALS — BP 104/76 | HR 82 | Ht 69.0 in | Wt 186.0 lb

## 2021-02-25 DIAGNOSIS — K219 Gastro-esophageal reflux disease without esophagitis: Secondary | ICD-10-CM | POA: Diagnosis not present

## 2021-02-25 NOTE — Patient Instructions (Signed)
If you are age 41 or older, your body mass index should be between 23-30. Your Body mass index is 27.47 kg/m. If this is out of the aforementioned range listed, please consider follow up with your Primary Care Provider.  If you are age 53 or younger, your body mass index should be between 19-25. Your Body mass index is 27.47 kg/m. If this is out of the aformentioned range listed, please consider follow up with your Primary Care Provider.   __________________________________________________________  The [facility] providers would like to encourage you to use [facility] to communicate with providers for non-urgent requests or questions.  Due to long hold times on the telephone, sending your provider a message by [facility] may be a faster and more efficient way to get a response.  Please allow 48 business hours for a response.  Please remember that this is for non-urgent requests.   You have been scheduled for an endoscopy. Please follow written instructions given to you at your visit today. If you use inhalers (even only as needed), please bring them with you on the day of your procedure.  It was a pleasure to see you today!  Thank you for trusting me with your gastrointestinal care!

## 2021-02-25 NOTE — Progress Notes (Signed)
[facility] Consult Note:  History: Riley Cardenas 02/25/2021  Referring provider: Dorothyann Peng, NP  Reason for consult/chief complaint: Gastroesophageal Reflux (Patient is on a regimen of Protonix 40mg , he has episodes of reflux at night and breakthrough occasionally)   Subjective  HPI: This is a very pleasant 40 year old man referred by primary care for longstanding reflux.  He has had symptoms about 20 years, and took no meds on a regular basis for many years.  With the last several years he has started regular use of PPI, now takes Protonix 40 mg at bedtime nightly.  Prior to being on meds he would have intermittent heartburn and regurgitation.  He might get flares of symptoms every couple of months despite being on therapy, and during that time it would feel hard to swallow. He is also concerned about nighttime cough and sometimes awakening with regurgitation of food and liquid.  He and his wife recently bought a new bed that allows him to elevate the head of bed, and he feels that is helped with the overnight symptoms. He smoked when he was younger but not for many years now, tries to eat healthy, keep his weight under control.  Sometimes he will eat in the evening within a few hours of bedtime.  He feels that overall his diet last are much healthier than they were when the symptoms started, but he will get breakthrough symptoms with certain dietary indiscretions.   ROS:  Review of Systems  Constitutional:  Negative for appetite change and unexpected weight change.  HENT:  Negative for mouth sores and voice change.   Eyes:  Negative for pain and redness.  Respiratory:  Negative for cough and shortness of breath.   Cardiovascular:  Negative for chest pain and palpitations.  Genitourinary:  Negative for dysuria and hematuria.  Musculoskeletal:  Negative for arthralgias and myalgias.  Skin:  Negative for pallor and rash.  Neurological:  Negative for weakness and  headaches.  Hematological:  Negative for adenopathy.  Psychiatric/Behavioral:         ADD    Past Medical History: Past Medical History:  Diagnosis Date   ADD (attention deficit disorder)    GERD (gastroesophageal reflux disease)      Past Surgical History: History reviewed. No pertinent surgical history.   Family History: Family History  Problem Relation Age of Onset   Arthritis Maternal Grandmother    Hypertension Maternal Grandfather    Arthritis Maternal Grandfather     Social History: Social History   Socioeconomic History   Marital status: Married    Spouse name: Not on file   Number of children: Not on file   Years of education: Not on file   Highest education level: Not on file  Occupational History   Not on file  Tobacco Use   Smoking status: Former    Pack years: 0.00   Smokeless tobacco: Never  Substance and Sexual Activity   Alcohol use: Yes    Alcohol/week: 0.0 standard drinks    Comment: occ   Drug use: No   Sexual activity: Yes    Partners: Female  Other Topics Concern   Not on file  Social History Narrative   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 30 capsule 0   amphetamine-dextroamphetamine (ADDERALL XR) 30 MG 24 hr capsule Take 1 capsule (30 mg total) by mouth daily. 30 capsule 0   [START ON 02/27/2021] amphetamine-dextroamphetamine (ADDERALL XR) 30 MG 24 hr capsule Take  1 capsule (30 mg total) by mouth daily. 30 capsule 0   amphetamine-dextroamphetamine (ADDERALL) 20 MG tablet Take 1 tablet (20 mg total) by mouth daily. 30 tablet 0   cetirizine (ZYRTEC) 10 MG tablet Take 10 mg by mouth daily.     pantoprazole (PROTONIX) 40 MG tablet Take 1 tablet (40 mg total) by mouth daily. 90 tablet 3   No current facility-administered medications for this visit.      ___________________________________________________________________ Objective   Exam:  BP 104/76   Pulse 82   Ht 5\' 9"  (1.753 m)   Wt 186 lb (84.4 kg)   BMI 27.47 kg/m  Wt Readings from Last 3 Encounters:  02/25/21 186 lb (84.4 kg)  01/22/21 183 lb (83 kg)  04/12/19 191 lb (86.6 kg)    General: Well-appearing, normal vocal quality Eyes: sclera anicteric, no redness ENT: oral mucosa moist without lesions, no cervical or supraclavicular lymphadenopathy CV: RRR without murmur, S1/S2, no JVD, no peripheral edema Resp: clear to auscultation bilaterally, normal RR and effort noted GI: soft, no tenderness, with active bowel sounds. No guarding or palpable organomegaly noted. Skin; warm and dry, no rash or jaundice noted Neuro: awake, alert and oriented x 3. Normal gross motor function and fluent speech  Labs:  CBC Latest Ref Rng & Units 01/22/2021 04/12/2019 07/23/2016  WBC 4.0 - 10.5 K/uL 5.5 9.3 5.7  Hemoglobin 13.0 - 17.0 g/dL 14.2 15.6 15.7  Hematocrit 39.0 - 52.0 % 40.6 45.5 45.4  Platelets 150.0 - 400.0 K/uL 209.0 217.0 218.0   No prior GI evaluation  Assessment: Encounter Diagnosis  Name Primary?   Gastroesophageal reflux disease, unspecified whether esophagitis present Yes   Many years of GERD, improved on regular dosing of acid suppression, but breakthrough symptoms primarily at night with coughing and regurgitation.  Episodic dysphagia during flares of reflux. Overall, this sounds typical for GERD rather than EOE.  We discussed the limitation of acid suppression therapy, breadth  of diet and lifestyle changes required for reflux control, the possibility of reflux related complications such as esophagitis, stricture or Barrett's esophagus.  Other anatomic considerations such as gastric outlet obstruction or hiatal hernia may contribute to reflux.   Plan:  Upper endoscopy.  He was agreeable after discussion of procedure and risks  The benefits and risks of the planned procedure were described in detail with the patient or (when appropriate) their health care proxy.  Risks were outlined as including, but not limited to, bleeding, infection, perforation, adverse medication reaction leading to cardiac or pulmonary decompensation, pancreatitis (if ERCP).  The limitation of incomplete mucosal visualization was also discussed.  No guarantees or warranties were given.  Depending on EGD findings, he may need pH and impedance testing and consideration of TIF or fundoplication for long-term symptom control.  Thank you for the courtesy of this consult.  Please call me with any questions or concerns.  Estill Cotta  Danis III  CC: Referring provider noted above

## 2021-02-26 ENCOUNTER — Other Ambulatory Visit: Payer: Self-pay

## 2021-02-26 ENCOUNTER — Ambulatory Visit (AMBULATORY_SURGERY_CENTER): Payer: BC Managed Care – PPO | Admitting: Gastroenterology

## 2021-02-26 ENCOUNTER — Encounter: Payer: Self-pay | Admitting: Gastroenterology

## 2021-02-26 VITALS — BP 125/78 | HR 60 | Temp 98.1°F | Resp 12 | Ht 69.0 in | Wt 186.0 lb

## 2021-02-26 DIAGNOSIS — K2950 Unspecified chronic gastritis without bleeding: Secondary | ICD-10-CM

## 2021-02-26 DIAGNOSIS — I8500 Esophageal varices without bleeding: Secondary | ICD-10-CM

## 2021-02-26 DIAGNOSIS — K2970 Gastritis, unspecified, without bleeding: Secondary | ICD-10-CM | POA: Diagnosis not present

## 2021-02-26 DIAGNOSIS — K219 Gastro-esophageal reflux disease without esophagitis: Secondary | ICD-10-CM

## 2021-02-26 DIAGNOSIS — K319 Disease of stomach and duodenum, unspecified: Secondary | ICD-10-CM | POA: Diagnosis not present

## 2021-02-26 MED ORDER — SODIUM CHLORIDE 0.9 % IV SOLN: 500.0000 mL | Freq: Once | INTRAVENOUS | Status: DC

## 2021-02-26 NOTE — Progress Notes (Signed)
Called to room to assist during endoscopic procedure.  Patient ID and intended procedure confirmed with present staff. Received instructions for my participation in the procedure from the performing physician.  

## 2021-02-26 NOTE — Telephone Encounter (Signed)
error 

## 2021-02-26 NOTE — Progress Notes (Signed)
Vitals-CW  History reviewed. 

## 2021-02-26 NOTE — Progress Notes (Signed)
A and O x3. Report to RN. Tolerated MAC anesthesia well.Teeth unchanged after procedure. 

## 2021-02-26 NOTE — Op Note (Signed)
[facility] Patient Name: Georgio Hattabaugh Procedure Date: 02/26/2021 2:52 PM MRN: [id number] Endoscopist: Mallie Mussel L. Loletha Carrow , MD Age: 40 Referring MD:  Date of Birth: November 17, 1980 Gender: Male Account #: [id number] Procedure:                Upper GI endoscopy Indications:              Esophageal reflux symptoms that persist despite                            appropriate therapy Medicines:                Monitored Anesthesia Care Procedure:                Pre-Anesthesia Assessment:                           - Prior to the procedure, a History and Physical                            was performed, and patient medications and                            allergies were reviewed. The patient's tolerance of                            previous anesthesia was also reviewed. The risks                            and benefits of the procedure and the sedation                            options and risks were discussed with the patient.                            All questions were answered, and informed consent                            was obtained. Prior Anticoagulants: The patient has                            taken no previous anticoagulant or antiplatelet                            agents. ASA Grade Assessment: II - A patient with                            mild systemic disease. After reviewing the risks                            and benefits, the patient was deemed in                            satisfactory condition to undergo the procedure.  After obtaining informed consent, the endoscope was                            passed under direct vision. Throughout the                            procedure, the patient's blood pressure, pulse, and                            oxygen saturations were monitored continuously. The                            Endoscope was introduced through the mouth, and                            advanced to the second part of duodenum. The  upper                            GI endoscopy was accomplished without difficulty.                            The patient tolerated the procedure well. Scope In: Scope Out: Findings:                 The lumen of the entire esophagus was markedly                            dilated. Several biopsies were obtained in the                            gastric body and in the gastric antrum with cold                            forceps for histology. No peristaltic activity was                            seen.                           Fluid and scant food debris was found in the middle                            third of the esophagus and in the lower third of                            the esophagus (suctioned). The scope passed through                            the EGJ without resistance.                           Apparent grade II varices were found in the middle  third of the esophagus and in the lower third of                            the esophagus. (prominent venous columns)                           Mildly congested (edematous) mucosa was found in                            the gastric fundus and in the gastric body.                           The cardia and gastric fundus were normal on                            retroflexion.                           The examined duodenum was normal. Complications:            No immediate complications. Estimated Blood Loss:     Estimated blood loss was minimal. Impression:               - Dilation in the entire esophagus.                           - Fluid in the middle third of the esophagus and in                            the lower third of the esophagus.                           - Apparent grade II esophageal varices. Patient has                            no history or clinical evidence to suggest liver                            disease. This finding may be secondary to the                            esopageal  dilatation.                           - Congestive gastropathy.                           - Normal examined duodenum.                           - Several biopsies were obtained in the gastric                            body and in the gastric antrum.  These findings are highly suggestive of achalasia. Recommendation:           - Patient has a contact number available for                            emergencies. The signs and symptoms of potential                            delayed complications were discussed with the                            patient. Return to normal activities tomorrow.                            Written discharge instructions were provided to the                            patient.                           - Resume previous diet. Do not eat within 4 hours                            of bed time.                           - Continue present medications.                           - Await pathology results.                           - Perform a barium swallow using barium in liquid                            and tablet form at appointment to be scheduled.                           - Perform a CT scan (computed tomography) of chest                            with contrast and abdomen with contrast at                            appointment to be scheduled. Oriah Leinweber L. Loletha Carrow, MD 02/26/2021 3:28:32 PM This report has been signed electronically.

## 2021-02-26 NOTE — Patient Instructions (Signed)
Please read handouts provided. Continue present medications. Await pathology results. Resume previous diet. Do not eat within 4 hours of bed time.   YOU HAD AN ENDOSCOPIC PROCEDURE TODAY AT [facility]:   Refer to the procedure report that was given to you for any specific questions about what was found during the examination.  If the procedure report does not answer your questions, please call your gastroenterologist to clarify.  If you requested that your care partner not be given the details of your procedure findings, then the procedure report has been included in a sealed envelope for you to review at your convenience later.  YOU SHOULD EXPECT: Some feelings of bloating in the abdomen. Passage of more gas than usual.  Walking can help get rid of the air that was put into your GI tract during the procedure and reduce the bloating. If you had a lower endoscopy (such as a colonoscopy or flexible sigmoidoscopy) you may notice spotting of blood in your stool or on the toilet paper. If you underwent a bowel prep for your procedure, you may not have a normal bowel movement for a few days.  Please Note:  You might notice some irritation and congestion in your nose or some drainage.  This is from the oxygen used during your procedure.  There is no need for concern and it should clear up in a day or so.  SYMPTOMS TO REPORT IMMEDIATELY:    Following upper endoscopy (EGD)  Vomiting of blood or coffee ground material  New chest pain or pain under the shoulder blades  Painful or persistently difficult swallowing  New shortness of breath  Fever of 100F or higher  Black, tarry-looking stools  For urgent or emergent issues, a gastroenterologist can be reached at any hour by calling [phone]. Do not use MyChart messaging for urgent concerns.    DIET:  We do recommend a small meal at first, but then you may proceed to your regular diet.  Drink plenty of fluids but you should  avoid alcoholic beverages for 24 hours.  ACTIVITY:  You should plan to take it easy for the rest of today and you should NOT DRIVE or use heavy machinery until tomorrow (because of the sedation medicines used during the test).    FOLLOW UP: Our staff will call the number listed on your records 48-72 hours following your procedure to check on you and address any questions or concerns that you may have regarding the information given to you following your procedure. If we do not reach you, we will leave a message.  We will attempt to reach you two times.  During this call, we will ask if you have developed any symptoms of COVID 19. If you develop any symptoms (ie: fever, flu-like symptoms, shortness of breath, cough etc.) before then, please call [phone].  If you test positive for Covid 19 in the 2 weeks post procedure, please call and report this information to [place].    If any biopsies were taken you will be contacted by phone or by letter within the next 1-3 weeks.  Please call us at [phone] if you have not heard about the biopsies in 3 weeks.    SIGNATURES/CONFIDENTIALITY: You and/or your care partner have signed paperwork which will be entered into your electronic medical record.  These signatures attest to the fact that that the information above on your After Visit Summary has been reviewed and is understood.  Full responsibility of the  confidentiality of this discharge information lies with you and/or your care-partner.  

## 2021-02-27 ENCOUNTER — Telehealth: Payer: Self-pay

## 2021-02-27 DIAGNOSIS — K220 Achalasia of cardia: Secondary | ICD-10-CM

## 2021-02-27 DIAGNOSIS — K219 Gastro-esophageal reflux disease without esophagitis: Secondary | ICD-10-CM

## 2021-02-27 NOTE — Telephone Encounter (Signed)
-----   Message from [place], MD sent at 02/26/2021  4:31 PM EDT ----- Please see my EGD report from today and schedule the following for this patient:   - Barium swallow with liquid and tablet.  Indication is GERD, upper endoscopy findings suggestive of achalasia.   - CT scan chest and abdomen with oral and IV contrast.  Indication esophageal varices and suspected achalasia, rule out cirrhosis and causes of pseudo achalasia  - HD

## 2021-02-27 NOTE — Telephone Encounter (Signed)
Imaging orders in epic.  Secure staff message sent to April Pait, radiology scheduler, to assist in scheduling appt.   Attempted to reach patient, his vm is full. Unable to leave a message at this time. My Chart message sent to patient.

## 2021-02-28 ENCOUNTER — Telehealth: Payer: Self-pay | Admitting: *Deleted

## 2021-02-28 ENCOUNTER — Telehealth: Payer: Self-pay

## 2021-02-28 NOTE — Telephone Encounter (Signed)
First post procedure follow up call, no answer 

## 2021-02-28 NOTE — Telephone Encounter (Signed)
Second follow up call made.

## 2021-03-04 ENCOUNTER — Other Ambulatory Visit: Payer: Self-pay

## 2021-03-04 ENCOUNTER — Ambulatory Visit (HOSPITAL_COMMUNITY)
Admission: RE | Admit: 2021-03-04 | Discharge: 2021-03-04 | Disposition: A | Discharge status: Home / Self Care | Payer: BC Managed Care – PPO | Source: Ambulatory Visit | Attending: Gastroenterology | Admitting: Gastroenterology

## 2021-03-04 DIAGNOSIS — K219 Gastro-esophageal reflux disease without esophagitis: Secondary | ICD-10-CM | POA: Diagnosis not present

## 2021-03-04 DIAGNOSIS — K220 Achalasia of cardia: Secondary | ICD-10-CM | POA: Diagnosis present

## 2021-03-04 MED ORDER — IOHEXOL 350 MG/ML SOLN
100.0000 mL | Freq: Once | INTRAVENOUS | Status: AC | PRN
  Administered 2021-03-04: 80 mL via INTRAVENOUS

## 2021-03-05 ENCOUNTER — Encounter: Payer: Self-pay | Admitting: Gastroenterology

## 2021-03-07 ENCOUNTER — Ambulatory Visit (HOSPITAL_COMMUNITY)
Admission: RE | Admit: 2021-03-07 | Discharge: 2021-03-07 | Disposition: A | Discharge status: Home / Self Care | Payer: BC Managed Care – PPO | Source: Ambulatory Visit | Attending: Gastroenterology | Admitting: Gastroenterology

## 2021-03-07 ENCOUNTER — Other Ambulatory Visit: Payer: Self-pay

## 2021-03-07 DIAGNOSIS — K219 Gastro-esophageal reflux disease without esophagitis: Secondary | ICD-10-CM | POA: Insufficient documentation

## 2021-03-07 DIAGNOSIS — K220 Achalasia of cardia: Secondary | ICD-10-CM

## 2021-03-25 ENCOUNTER — Encounter: Payer: Self-pay | Admitting: Gastroenterology

## 2021-03-25 ENCOUNTER — Ambulatory Visit: Payer: BC Managed Care – PPO | Admitting: Gastroenterology

## 2021-03-25 VITALS — BP 106/70 | HR 68 | Ht 69.25 in | Wt 189.5 lb

## 2021-03-25 DIAGNOSIS — K220 Achalasia of cardia: Secondary | ICD-10-CM | POA: Diagnosis not present

## 2021-03-25 DIAGNOSIS — R1319 Other dysphagia: Secondary | ICD-10-CM

## 2021-03-25 NOTE — Patient Instructions (Signed)
We will send your records to [facility] - Dr Hilbert Corrigan [facility]  Office: [phone] Fax: [phone]  Use My Current Location to calculate the distance to this facility. They will reach out to you to schedule   If you are age 41 or older, your body mass index should be between 23-30. Your Body mass index is 27.78 kg/m. If this is out of the aforementioned range listed, please consider follow up with your Primary Care Provider.  If you are age 18 or younger, your body mass index should be between 19-25. Your Body mass index is 27.78 kg/m. If this is out of the aformentioned range listed, please consider follow up with your Primary Care Provider.   __________________________________________________________  The [facility] providers would like to encourage you to use [facility] to communicate with providers for non-urgent requests or questions.  Due to long hold times on the telephone, sending your provider a message by [facility] may be a faster and more efficient way to get a response.  Please allow 48 business hours for a response.  Please remember that this is for non-urgent requests.   It was a pleasure to see you today!  Thank you for trusting me with your gastrointestinal care!

## 2021-03-25 NOTE — Progress Notes (Signed)
[facility] Progress Note  Chief Complaint: Dysphagia  Subjective  History: Riley Cardenas recently saw me for clinic consult to evaluate longstanding symptoms consistent with reflux and some intermittent dysphagia.  He was having worsening nocturnal regurgitation.  Upper endoscopy findings highly suggestive of achalasia, barium swallow scheduled with results noted below.  On endoscopy I also noted he had somewhat prominent submucosal veins that I described as varices, though they did not look like typical varices.  For that reason, CT chest and abdomen was done to evaluate for any mass and might be causing pseudo achalasia or evidence of liver disease and portal hypertension, neither of which were found.  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  He has been more careful about not eating within several hours of bed, staying upright especially after drinking liquids in the evening, and he has been able to sleep elevated with a mechanical bed.  ROS: Cardiovascular:  no chest pain Respiratory: no dyspnea  The patient's Past Medical, Family and Social History were reviewed and are on file in the EMR.  Objective:  Med list reviewed  Current Outpatient Medications:    amphetamine-dextroamphetamine (ADDERALL XR) 30 MG 24 hr capsule, Take 1 capsule (30 mg total) by mouth daily., Disp: 30 capsule, Rfl: 0   amphetamine-dextroamphetamine (ADDERALL XR) 30 MG 24 hr capsule, Take 1 capsule (30 mg total) by mouth daily., Disp: 30 capsule, Rfl: 0   amphetamine-dextroamphetamine (ADDERALL XR) 30 MG 24 hr capsule, Take 1 capsule (30 mg total) by mouth daily., Disp: 30 capsule, Rfl: 0   amphetamine-dextroamphetamine (ADDERALL) 20 MG tablet, Take 1 tablet (20 mg total) by mouth daily., Disp: 30 tablet, Rfl: 0   cetirizine (ZYRTEC) 10 MG tablet, Take 10 mg by mouth daily., Disp: , Rfl:    pantoprazole (PROTONIX) 40 MG  tablet, Take 1 tablet (40 mg total) by mouth daily., Disp: 90 tablet, Rfl: 3   Vital signs in last 24 hrs: Vitals:   03/25/21 0846  BP: 106/70  Pulse: 68   Wt Readings from Last 3 Encounters:  03/25/21 189 lb 8 oz (86 kg)  02/26/21 186 lb (84.4 kg)  02/25/21 186 lb (84.4 kg)    No exam-entire visit spent in record review and discussion  Labs:   ___________________________________________ Radiologic studies:  CLINICAL DATA:  GERD.  Rule out achalasia   EXAM: ESOPHOGRAM / BARIUM SWALLOW / BARIUM TABLET STUDY   TECHNIQUE: Combined double contrast and single contrast examination performed using effervescent crystals, thick barium liquid, and thin barium liquid. The patient was observed with fluoroscopy swallowing a 13 mm barium sulphate tablet.   FLUOROSCOPY TIME:  Fluoroscopy Time:  1 minutes 42 second   Radiation Exposure Index (if provided by the fluoroscopic device):   Number of Acquired Spot Images: 10   COMPARISON:  CT chest abdomen pelvis 03/04/2021   FINDINGS: The esophagus is markedly dilated throughout the thoracic component. There is smooth tapering of the esophagus at the lower esophageal sphincter which intermittently allowed small squirts of barium to pass into the stomach. The esophagus has poor motility. No ulcer or mass in the esophagus. Pharyngeal phase of swallowing appeared normal. Cervical esophagus mildly dilated.   No reflux identified. Barium tablet did not pass through the lower esophageal sphincter.   IMPRESSION: Markedly dilated esophagus with poor motility. Smooth tapering of the esophagus at the lower esophageal sphincter compatible with chronic achalasia.  Images and diagnosis reviewed with patient.     Electronically Signed   By: Franchot Gallo M.D.   On: 03/07/2021 10:03CLINICAL DATA:  Esophageal varices with suspected achalasia.  (Images personally reviewed -- H. Danis)   _____________________________________________________   Jasmine December: CT CHEST, ABDOMEN, AND PELVIS WITH CONTRAST   TECHNIQUE: Multidetector CT imaging of the chest, abdomen and pelvis was performed following the standard protocol during bolus administration of intravenous contrast.   CONTRAST:  56m OMNIPAQUE IOHEXOL 350 MG/ML SOLN   COMPARISON:  None.   FINDINGS: CT CHEST FINDINGS   Cardiovascular: The heart size is normal. No substantial pericardial effusion. No thoracic aortic aneurysm.   Mediastinum/Nodes: No mediastinal lymphadenopathy. There is no hilar lymphadenopathy. Esophagus is diffusely patulous and filled with contrast material suggesting dysmotility. There is no axillary lymphadenopathy.   Lungs/Pleura: 4 mm perifissural right upper lobe nodule identified on 79/4. No suspicious pulmonary nodule or mass. No focal airspace consolidation. There is no evidence of pleural effusion.   Musculoskeletal: No worrisome lytic or sclerotic osseous abnormality.   CT ABDOMEN PELVIS FINDINGS   Hepatobiliary: No suspicious focal abnormality within the liver parenchyma. No morphologic features to suggest cirrhosis. Gallbladder is nondistended. No intrahepatic or extrahepatic biliary dilation.   Pancreas: No focal mass lesion. No dilatation of the main duct. No intraparenchymal cyst. No peripancreatic edema.   Spleen: No splenomegaly. No focal mass lesion.   Adrenals/Urinary Tract: No adrenal nodule or mass. Kidneys unremarkable. No evidence for hydroureter. The urinary bladder appears normal for the degree of distention.   Stomach/Bowel: Stomach is unremarkable. No gastric wall thickening. No evidence of outlet obstruction. Duodenum is normally positioned as is the ligament of Treitz. No small bowel wall thickening. No small bowel dilatation. The terminal ileum is normal. The appendix is normal. No gross colonic mass. No colonic wall thickening.   Vascular/Lymphatic: No  abdominal aortic aneurysm. No abdominal aortic atherosclerotic calcification. There is no gastrohepatic or hepatoduodenal ligament lymphadenopathy. No retroperitoneal or mesenteric lymphadenopathy. No pelvic sidewall lymphadenopathy.   Reproductive: The prostate gland and seminal vesicles are unremarkable.   Other: No intraperitoneal free fluid.   Musculoskeletal: No worrisome lytic or sclerotic osseous abnormality.   IMPRESSION: 1. Diffusely patulous and filled with contrast material suggesting dysmotility and compatible with reported history of achalasia. No evidence for distal esophageal or proximal gastric mass lesion. 2. No morphologic features of cirrhosis. No CT evidence of paraesophageal varices. 3. 4 mm perifissural right upper lobe pulmonary nodule. This is most likely benign and probably represents a subpleural lymph node.     Electronically Signed   By: EMisty StanleyM.D.   On: 03/06/2021 06:39 ____________________________________________ Other:   _____________________________________________ Assessment & Plan  Assessment: Encounter Diagnoses  Name Primary?   Achalasia Yes   Esophageal dysphagia    I explained his achalasia diagnosis, uncertain nature of this condition, possible additional testing and available therapies (large diameter balloon dilation, surgical myotomy, possibly with fundoplication, and POEM)  I think POEM may be the right choice for him, and I have encouraged him to seek consultation with Dr. DHilbert [facility]of [facility], who specializes in esophageal conditions and performs this procedure. We have not scheduled a manometry study, as I am not sure will be necessary in his case.  However, if Dr. DLeroy Seafeels it would be helpful for diagnostic or therapeutic planning purposes, we can certainly get it done.   Plan: [facility] referral Stop ppi since it has not been helping, and his  diagnosis is clearly not GERD. Caution eating and  elevate HOB  23 minutes were spent on this encounter (including chart review, history/exam, counseling/coordination of care, and documentation) > 50% of that time was spent on counseling and coordination of care.  Topics discussed included: See above.  Nelida Meuse III

## 2021-03-26 ENCOUNTER — Telehealth: Payer: Self-pay

## 2021-03-26 NOTE — Telephone Encounter (Signed)
Records faxed to Dr Hilbert Corrigan for NP appointment, will await appointment information Ph [phone] Fx [phone]

## 2021-05-02 NOTE — Telephone Encounter (Signed)
Patient has been scheduled for 05-07-2021 @ 9am

## 2021-05-21 ENCOUNTER — Other Ambulatory Visit: Payer: Self-pay

## 2021-05-21 ENCOUNTER — Ambulatory Visit: Payer: BC Managed Care – PPO | Admitting: Adult Health

## 2021-05-21 ENCOUNTER — Encounter: Payer: Self-pay | Admitting: Adult Health

## 2021-05-21 DIAGNOSIS — F909 Attention-deficit hyperactivity disorder, unspecified type: Secondary | ICD-10-CM

## 2021-05-21 MED ORDER — AMPHETAMINE-DEXTROAMPHET ER 30 MG PO CP24: 30.0000 mg | ORAL_CAPSULE | Freq: Every day | ORAL | 0 refills | Status: DC

## 2021-05-21 MED ORDER — AMPHETAMINE-DEXTROAMPHETAMINE 20 MG PO TABS: 20.0000 mg | ORAL_TABLET | Freq: Every day | ORAL | 0 refills | Status: DC

## 2021-05-21 NOTE — Progress Notes (Signed)
Riley Cardenas [id number] [birth year] 39 y.o.  Subjective:   Patient ID:  Riley Cardenas is a 40 y.o. (DOB November 23, 1980) male.  Chief Complaint: No chief complaint on file.   HPI Riley Cardenas presents to the office today for follow-up of ADHD.  Describes mood today as "ok". Pleasant. Mood symptoms - denies depression, anxiety, and irritability. Stating "I'm doing good". Feels like medications continue to work well. Stable interest and motivation. Taking medications as prescribed.  Energy levels stable. Active, has a regular exercise routine. Running.   Enjoys some usual interests and activities. Married. Lives with wife his 2 sons. Spending time with family.  Appetite adequate. Weight 185 pounds. Sleeps better some nights than others. Averages 6 to 8 hours. Focus and concentration stable. Completing tasks. Managing aspects of household. 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  Denies AH or VH.   [place] Office Visit from 01/22/2021 in [place] at [facility] from 04/12/2019 in [place] at [facility] Total Score 1 0        Review of Systems:  Review of Systems  Musculoskeletal:  Negative for gait problem.  Neurological:  Negative for tremors.  Psychiatric/Behavioral:         Please refer to HPI   Medications: I have reviewed the patient's current medications.  Current Outpatient Medications  Medication Sig Dispense Refill   amphetamine-dextroamphetamine (ADDERALL XR) 30 MG 24 hr capsule Take 1 capsule (30 mg total) by mouth daily. 30 capsule 0   [START ON 06/18/2021] amphetamine-dextroamphetamine (ADDERALL XR) 30 MG 24 hr capsule Take 1 capsule (30 mg total) by mouth daily. 30 capsule 0   [START ON 07/16/2021] amphetamine-dextroamphetamine (ADDERALL XR) 30 MG 24 hr capsule Take 1 capsule (30 mg total) by mouth daily. 30 capsule 0   amphetamine-dextroamphetamine (ADDERALL) 20 MG tablet Take 1 tablet (20 mg total) by mouth daily.  30 tablet 0   cetirizine (ZYRTEC) 10 MG tablet Take 10 mg by mouth daily.     pantoprazole (PROTONIX) 40 MG tablet Take 1 tablet (40 mg total) by mouth daily. 90 tablet 3   No current facility-administered medications for this visit.    Medication Side Effects: None  Allergies: No Known Allergies  Past Medical History:  Diagnosis Date   ADD (attention deficit disorder)    GERD (gastroesophageal reflux disease)     Past Medical History, Surgical history, Social history, and Family history were reviewed and updated as appropriate.   Please see review of systems for further details on the patient's review from today.   Objective:   Physical Exam:  There were no vitals taken for this visit.  Physical Exam Constitutional:      General: He is not in acute distress. Musculoskeletal:        General: No deformity.  Neurological:     Mental Status: He is alert and oriented to person, place, and time.     Coordination: Coordination normal.  Psychiatric:        Attention and Perception: Attention and perception normal. He does not perceive auditory or visual hallucinations.        Mood and Affect: Mood normal. Mood is not anxious or depressed. Affect is not labile, blunt, angry or inappropriate.        Speech: Speech normal.        Behavior: Behavior normal.        Thought Content: Thought content normal. Thought content is not paranoid or  delusional. Thought content does not include homicidal or suicidal ideation. Thought content does not include homicidal or suicidal plan.        Cognition and Memory: Cognition and memory normal.        Judgment: Judgment normal.     Comments: Insight intact    Lab Review:     Component Value Date/Time   NA 139 01/22/2021 0734   K 3.8 01/22/2021 0734   CL 103 01/22/2021 0734   CO2 28 01/22/2021 0734   GLUCOSE 82 01/22/2021 0734   BUN 13 01/22/2021 0734   CREATININE 0.90 01/22/2021 0734   CALCIUM 9.3 01/22/2021 0734   PROT 6.6 01/22/2021  0734   ALBUMIN 4.3 01/22/2021 0734   AST 20 01/22/2021 0734   ALT 12 01/22/2021 0734   ALKPHOS 51 01/22/2021 0734   BILITOT 1.4 (H) 01/22/2021 0734       Component Value Date/Time   WBC 5.5 01/22/2021 0734   RBC 4.59 01/22/2021 0734   HGB 14.2 01/22/2021 0734   HCT 40.6 01/22/2021 0734   PLT 209.0 01/22/2021 0734   MCV 88.4 01/22/2021 0734   MCHC 34.9 01/22/2021 0734   RDW 13.6 01/22/2021 0734   LYMPHSABS 1.8 01/22/2021 0734   MONOABS 0.4 01/22/2021 0734   EOSABS 0.2 01/22/2021 0734   BASOSABS 0.1 01/22/2021 0734    No results found for: POCLITH, LITHIUM   No results found for: PHENYTOIN, PHENOBARB, VALPROATE, CBMZ   .res Assessment: Plan:    Plan:  1. Adderall XR 30mg  BID  2. Adderall 20mg  daily - 1 script sent  106/70  RTC 3 months  Patient advised to contact office with any questions, adverse effects, or acute worsening in signs and symptoms.  Discussed potential benefits, risks, and side effects of stimulants with patient to include increased heart rate, palpitations, insomnia, increased anxiety, increased irritability, or decreased appetite.  Instructed patient to contact office if experiencing any significant tolerability issues. There are no diagnoses linked to this encounter.   Diagnoses and all orders for this visit:  Attention deficit hyperactivity disorder (ADHD), unspecified ADHD type -     amphetamine-dextroamphetamine (ADDERALL XR) 30 MG 24 hr capsule; Take 1 capsule (30 mg total) by mouth daily. -     amphetamine-dextroamphetamine (ADDERALL XR) 30 MG 24 hr capsule; Take 1 capsule (30 mg total) by mouth daily. -     amphetamine-dextroamphetamine (ADDERALL XR) 30 MG 24 hr capsule; Take 1 capsule (30 mg total) by mouth daily. -     amphetamine-dextroamphetamine (ADDERALL) 20 MG tablet; Take 1 tablet (20 mg total) by mouth daily.    Please see After Visit Summary for patient specific instructions.  No future appointments.  No orders of the defined  types were placed in this encounter.   -------------------------------

## 2021-08-21 ENCOUNTER — Encounter: Payer: Self-pay | Admitting: Adult Health

## 2021-08-21 ENCOUNTER — Other Ambulatory Visit: Payer: Self-pay

## 2021-08-21 ENCOUNTER — Ambulatory Visit: Payer: BC Managed Care – PPO | Admitting: Adult Health

## 2021-08-21 DIAGNOSIS — F909 Attention-deficit hyperactivity disorder, unspecified type: Secondary | ICD-10-CM

## 2021-08-21 MED ORDER — AMPHETAMINE-DEXTROAMPHET ER 30 MG PO CP24: 30.0000 mg | ORAL_CAPSULE | Freq: Every day | ORAL | 0 refills | Status: DC

## 2021-08-21 MED ORDER — AMPHETAMINE-DEXTROAMPHETAMINE 20 MG PO TABS: 20.0000 mg | ORAL_TABLET | Freq: Every day | ORAL | 0 refills | Status: DC

## 2021-08-21 NOTE — Progress Notes (Signed)
Riley Cardenas [id number] 12-Apr-1981 41 y.o.  Subjective:   Patient ID:  Riley Cardenas is a 41 y.o. (DOB [birth year]) male.  Chief Complaint: No chief complaint on file.   HPI Riley Cardenas presents to the office today for follow-up of ADHD.  Describes mood today as "ok". Pleasant. Denies tearfulness. Mood symptoms - denies depression, anxiety, and irritability. Stating "I'm doing good". Feels like medications continue to work well. Recovering from recent surgery. Would like to speak with a therapist. Stable interest and motivation. Taking medications as prescribed.  Energy levels stable. Active, has a regular exercise routine. Running.   Enjoys some usual interests and activities. Married. Lives with wife his 2 sons. Spending time with family.  Appetite adequate. Weight 185 pounds. Sleeps better some nights than others. Averages 6 to 8 hours. Focus and concentration stable. Completing tasks. Managing aspects of household. 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  Denies AH or VH.     [place] Office Visit from 01/22/2021 in [place] at [facility] from 04/12/2019 in [place] at [facility] Total Score 1 0        Review of Systems:  Review of Systems  Musculoskeletal:  Negative for gait problem.  Neurological:  Negative for tremors.  Psychiatric/Behavioral:         Please refer to HPI   Medications: I have reviewed the patient's current medications.  Current Outpatient Medications  Medication Sig Dispense Refill   amphetamine-dextroamphetamine (ADDERALL XR) 30 MG 24 hr capsule Take 1 capsule (30 mg total) by mouth daily. 30 capsule 0   [START ON 09/18/2021] amphetamine-dextroamphetamine (ADDERALL XR) 30 MG 24 hr capsule Take 1 capsule (30 mg total) by mouth daily. 30 capsule 0   [START ON 10/16/2021] amphetamine-dextroamphetamine (ADDERALL XR) 30 MG 24 hr capsule Take 1 capsule (30 mg total) by mouth daily. 30 capsule 0    amphetamine-dextroamphetamine (ADDERALL) 20 MG tablet Take 1 tablet (20 mg total) by mouth daily. 30 tablet 0   cetirizine (ZYRTEC) 10 MG tablet Take 10 mg by mouth daily.     pantoprazole (PROTONIX) 40 MG tablet Take 1 tablet (40 mg total) by mouth daily. 90 tablet 3   No current facility-administered medications for this visit.    Medication Side Effects: None  Allergies: No Known Allergies  Past Medical History:  Diagnosis Date   ADD (attention deficit disorder)    GERD (gastroesophageal reflux disease)     Past Medical History, Surgical history, Social history, and Family history were reviewed and updated as appropriate.   Please see review of systems for further details on the patient's review from today.   Objective:   Physical Exam:  There were no vitals taken for this visit.  Physical Exam Constitutional:      General: He is not in acute distress. Musculoskeletal:        General: No deformity.  Neurological:     Mental Status: He is alert and oriented to person, place, and time.     Coordination: Coordination normal.  Psychiatric:        Attention and Perception: Attention and perception normal. He does not perceive auditory or visual hallucinations.        Mood and Affect: Mood normal. Mood is not anxious or depressed. Affect is not labile, blunt, angry or inappropriate.        Speech: Speech normal.        Behavior: Behavior normal.  Thought Content: Thought content normal. Thought content is not paranoid or delusional. Thought content does not include homicidal or suicidal ideation. Thought content does not include homicidal or suicidal plan.        Cognition and Memory: Cognition and memory normal.        Judgment: Judgment normal.     Comments: Insight intact    Lab Review:     Component Value Date/Time   NA 139 01/22/2021 0734   K 3.8 01/22/2021 0734   CL 103 01/22/2021 0734   CO2 28 01/22/2021 0734   GLUCOSE 82 01/22/2021 0734   BUN 13  01/22/2021 0734   CREATININE 0.90 01/22/2021 0734   CALCIUM 9.3 01/22/2021 0734   PROT 6.6 01/22/2021 0734   ALBUMIN 4.3 01/22/2021 0734   AST 20 01/22/2021 0734   ALT 12 01/22/2021 0734   ALKPHOS 51 01/22/2021 0734   BILITOT 1.4 (H) 01/22/2021 0734       Component Value Date/Time   WBC 5.5 01/22/2021 0734   RBC 4.59 01/22/2021 0734   HGB 14.2 01/22/2021 0734   HCT 40.6 01/22/2021 0734   PLT 209.0 01/22/2021 0734   MCV 88.4 01/22/2021 0734   MCHC 34.9 01/22/2021 0734   RDW 13.6 01/22/2021 0734   LYMPHSABS 1.8 01/22/2021 0734   MONOABS 0.4 01/22/2021 0734   EOSABS 0.2 01/22/2021 0734   BASOSABS 0.1 01/22/2021 0734    No results found for: POCLITH, LITHIUM   No results found for: PHENYTOIN, PHENOBARB, VALPROATE, CBMZ   .res Assessment: Plan:    Plan:  1. Adderall XR 30mg  BID  2. Adderall 20mg  daily - 1 script sent  Set up with Rinaldo Cloud  118/76/71  RTC 3 months  Patient advised to contact office with any questions, adverse effects, or acute worsening in signs and symptoms.  Discussed potential benefits, risks, and side effects of stimulants with patient to include increased heart rate, palpitations, insomnia, increased anxiety, increased irritability, or decreased appetite.  Instructed patient to contact office if experiencing any significant tolerability issues.   Diagnoses and all orders for this visit:  Attention deficit hyperactivity disorder (ADHD), unspecified ADHD type -     amphetamine-dextroamphetamine (ADDERALL XR) 30 MG 24 hr capsule; Take 1 capsule (30 mg total) by mouth daily. -     amphetamine-dextroamphetamine (ADDERALL XR) 30 MG 24 hr capsule; Take 1 capsule (30 mg total) by mouth daily. -     amphetamine-dextroamphetamine (ADDERALL XR) 30 MG 24 hr capsule; Take 1 capsule (30 mg total) by mouth daily. -     amphetamine-dextroamphetamine (ADDERALL) 20 MG tablet; Take 1 tablet (20 mg total) by mouth daily.     Please see After Visit Summary  for patient specific instructions.  Future Appointments  Date Time Provider [place]  11/19/2021  8:00 AM Brin Ruggerio, Berdie Ogren, NP [facility] None    No orders of the defined types were placed in this encounter.   -------------------------------

## 2021-10-06 ENCOUNTER — Telehealth: Payer: Self-pay | Admitting: Adult Health

## 2021-10-06 ENCOUNTER — Other Ambulatory Visit: Payer: Self-pay

## 2021-10-06 DIAGNOSIS — F909 Attention-deficit hyperactivity disorder, unspecified type: Secondary | ICD-10-CM

## 2021-10-06 MED ORDER — AMPHETAMINE-DEXTROAMPHET ER 30 MG PO CP24: 30.0000 mg | ORAL_CAPSULE | Freq: Every day | ORAL | 0 refills | Status: DC

## 2021-10-06 NOTE — Telephone Encounter (Signed)
Next appt 4/5

## 2021-10-06 NOTE — Telephone Encounter (Signed)
Pt would like a refill of Adderall XR 30mg  to [facility].  He advised [facility]ist at another location told him it was available there

## 2021-10-06 NOTE — Telephone Encounter (Signed)
Pended to [facility]

## 2021-11-19 ENCOUNTER — Encounter: Payer: Self-pay | Admitting: Adult Health

## 2021-11-19 ENCOUNTER — Ambulatory Visit: Payer: BC Managed Care – PPO | Admitting: Adult Health

## 2021-11-19 DIAGNOSIS — F909 Attention-deficit hyperactivity disorder, unspecified type: Secondary | ICD-10-CM

## 2021-11-19 MED ORDER — AMPHETAMINE-DEXTROAMPHETAMINE 20 MG PO TABS: 20.0000 mg | ORAL_TABLET | Freq: Every day | ORAL | 0 refills | Status: DC

## 2021-11-19 MED ORDER — AMPHETAMINE-DEXTROAMPHET ER 30 MG PO CP24: 30.0000 mg | ORAL_CAPSULE | Freq: Every day | ORAL | 0 refills | Status: DC

## 2021-11-19 NOTE — Progress Notes (Signed)
Riley Cardenas ?[id number] ?1981/07/17 ?41 y.o. ? ?Subjective:  ? ?Patient ID:  Riley Cardenas is a 41 y.o. (DOB 02-May-1981) male. ? ?Chief Complaint: No chief complaint on file. ? ? ?HPI ?Augustine Radar presents to the office today for follow-up of ADHD. ? ?Describes mood today as "ok". Pleasant. Denies tearfulness. Mood symptoms - denies depression, anxiety, and irritability. Stating "I'm doing alright". Feels like medications continue to work well. Stable interest and motivation. Taking medications as prescribed.  ?Energy levels stable. Active, does not have a regular exercise routine. ?Enjoys some usual interests and activities. Married. Lives with wife his 2 sons. Spending time with family.  ?Appetite adequate. Weight gain - 5 pounds 190 pounds. ?Sleeps better some nights than others. Averages 6 to 8 hours. ?Focus and concentration stable. Completing tasks. Managing aspects of household. 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  ?Denies AH or VH. ?Planning to see a therapist. ? ? ?PHQ2-9   ? ?[place] Office Visit from 01/22/2021 in [place] at [facility] from 04/12/2019 in [place] at [facility]  ?PHQ-2 Total Score 1 0  ? ?  ?  ? ?Review of Systems:  ?Review of Systems  ?Musculoskeletal:  Negative for gait problem.  ?Neurological:  Negative for tremors.  ?Psychiatric/Behavioral:    ?     Please refer to HPI  ? ?Medications: I have reviewed the patient's current medications. ? ?Current Outpatient Medications  ?Medication Sig Dispense Refill  ? amphetamine-dextroamphetamine (ADDERALL XR) 30 MG 24 hr capsule Take 1 capsule (30 mg total) by mouth daily. 30 capsule 0  ? [START ON 12/17/2021] amphetamine-dextroamphetamine (ADDERALL XR) 30 MG 24 hr capsule Take 1 capsule (30 mg total) by mouth daily. 30 capsule 0  ? [START ON 01/14/2022] amphetamine-dextroamphetamine (ADDERALL XR) 30 MG 24 hr capsule Take 1 capsule (30 mg total) by mouth daily. 30 capsule 0  ? amphetamine-dextroamphetamine  (ADDERALL) 20 MG tablet Take 1 tablet (20 mg total) by mouth daily. 30 tablet 0  ? cetirizine (ZYRTEC) 10 MG tablet Take 10 mg by mouth daily.    ? pantoprazole (PROTONIX) 40 MG tablet Take 1 tablet (40 mg total) by mouth daily. 90 tablet 3  ? ?No current facility-administered medications for this visit.  ? ? ?Medication Side Effects: None ? ?Allergies: No Known Allergies ? ?Past Medical History:  ?Diagnosis Date  ? ADD (attention deficit disorder)   ? GERD (gastroesophageal reflux disease)   ? ? ?Past Medical History, Surgical history, Social history, and Family history were reviewed and updated as appropriate.  ? ?Please see review of systems for further details on the patient's review from today.  ? ?Objective:  ? ?Physical Exam:  ?There were no vitals taken for this visit. ? ?Physical Exam ?Constitutional:   ?   General: He is not in acute distress. ?Musculoskeletal:     ?   General: No deformity.  ?Neurological:  ?   Mental Status: He is alert and oriented to person, place, and time.  ?   Coordination: Coordination normal.  ?Psychiatric:     ?   Attention and Perception: Attention and perception normal. He does not perceive auditory or visual hallucinations.     ?   Mood and Affect: Mood normal. Mood is not anxious or depressed. Affect is not labile, blunt, angry or inappropriate.     ?   Speech: Speech normal.     ?   Behavior: Behavior normal.     ?   Thought  Content: Thought content normal. Thought content is not paranoid or delusional. Thought content does not include homicidal or suicidal ideation. Thought content does not include homicidal or suicidal plan.     ?   Cognition and Memory: Cognition and memory normal.     ?   Judgment: Judgment normal.  ?   Comments: Insight intact  ? ? ?Lab Review:  ?   ?Component Value Date/Time  ? NA 139 01/22/2021 0734  ? K 3.8 01/22/2021 0734  ? CL 103 01/22/2021 0734  ? CO2 28 01/22/2021 0734  ? GLUCOSE 82 01/22/2021 0734  ? BUN 13 01/22/2021 0734  ? CREATININE 0.90  01/22/2021 0734  ? CALCIUM 9.3 01/22/2021 0734  ? PROT 6.6 01/22/2021 0734  ? ALBUMIN 4.3 01/22/2021 0734  ? AST 20 01/22/2021 0734  ? ALT 12 01/22/2021 0734  ? ALKPHOS 51 01/22/2021 0734  ? BILITOT 1.4 (H) 01/22/2021 0734  ? ? ?   ?Component Value Date/Time  ? WBC 5.5 01/22/2021 0734  ? RBC 4.59 01/22/2021 0734  ? HGB 14.2 01/22/2021 0734  ? HCT 40.6 01/22/2021 0734  ? PLT 209.0 01/22/2021 0734  ? MCV 88.4 01/22/2021 0734  ? MCHC 34.9 01/22/2021 0734  ? RDW 13.6 01/22/2021 0734  ? LYMPHSABS 1.8 01/22/2021 0734  ? MONOABS 0.4 01/22/2021 0734  ? EOSABS 0.2 01/22/2021 0734  ? BASOSABS 0.1 01/22/2021 0734  ? ? ?No results found for: POCLITH, LITHIUM  ? ?No results found for: PHENYTOIN, PHENOBARB, VALPROATE, CBMZ  ? ?.res ?Assessment: Plan:   ? ?Plan: ? ?1. Adderall XR '30mg'$  once ?2. Adderall '20mg'$  daily - 1 script sent ? ?Set up with Rinaldo Cloud ? ?122/74/78 ? ?RTC 3 months ? ?Patient advised to contact office with any questions, adverse effects, or acute worsening in signs and symptoms. ? ?Discussed potential benefits, risks, and side effects of stimulants with patient to include increased heart rate, palpitations, insomnia, increased anxiety, increased irritability, or decreased appetite.  Instructed patient to contact office if experiencing any significant tolerability issues. ? ?Diagnoses and all orders for this visit: ? ?Attention deficit hyperactivity disorder (ADHD), unspecified ADHD type ?-     amphetamine-dextroamphetamine (ADDERALL XR) 30 MG 24 hr capsule; Take 1 capsule (30 mg total) by mouth daily. ?-     amphetamine-dextroamphetamine (ADDERALL XR) 30 MG 24 hr capsule; Take 1 capsule (30 mg total) by mouth daily. ?-     amphetamine-dextroamphetamine (ADDERALL XR) 30 MG 24 hr capsule; Take 1 capsule (30 mg total) by mouth daily. ?-     amphetamine-dextroamphetamine (ADDERALL) 20 MG tablet; Take 1 tablet (20 mg total) by mouth daily. ? ?  ? ?Please see After Visit Summary for patient specific  instructions. ? ?Future Appointments  ?Date Time Provider [place]  ?02/13/2022  8:20 AM Asaf Elmquist, Berdie Ogren, NP CP-CP None  ? ? ?No orders of the defined types were placed in this encounter. ? ? ?------------------------------- ?

## 2022-02-13 ENCOUNTER — Ambulatory Visit: Payer: BC Managed Care – PPO | Admitting: Adult Health

## 2022-02-13 ENCOUNTER — Encounter: Payer: Self-pay | Admitting: Adult Health

## 2022-02-13 DIAGNOSIS — F909 Attention-deficit hyperactivity disorder, unspecified type: Secondary | ICD-10-CM | POA: Diagnosis not present

## 2022-02-13 MED ORDER — AMPHETAMINE-DEXTROAMPHET ER 30 MG PO CP24: 30.0000 mg | ORAL_CAPSULE | Freq: Every day | ORAL | 0 refills | Status: DC

## 2022-02-13 MED ORDER — AMPHETAMINE-DEXTROAMPHETAMINE 20 MG PO TABS: 20.0000 mg | ORAL_TABLET | Freq: Every day | ORAL | 0 refills | Status: DC

## 2022-02-13 NOTE — Progress Notes (Signed)
Riley Cardenas [id number] [birth year] 41 y.o.  Subjective:   Patient ID:  Riley Cardenas is a 41 y.o. (DOB 03/08/1981) male.  Chief Complaint: No chief complaint on file.   HPI Riley Cardenas presents to the office today for follow-up of ADHD.  Describes mood today as "ok". Pleasant. Denies tearfulness. Mood symptoms - denies depression, anxiety, and irritability. Mood is consistent. Stating "I'm doing alright". Feels like medications continue to work well. Stable interest and motivation. Taking medications as prescribed.  Energy levels stable. Active, does not have a regular exercise routine. Enjoys some usual interests and activities. Married. Lives with wife his 2 sons. Spending time with family.  Appetite adequate. Weight gain - 5 pounds 190 pounds. Sleeps better some nights than others. Averages 6 to 8 hours. Focus and concentration stable. Completing tasks. Managing aspects of household. 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  Denies AH or VH. Planning to see a therapist.    [place] Office Visit from 01/22/2021 in [place] at [facility] from 04/12/2019 in [place] at [facility] Total Score 1 0        Review of Systems:  Review of Systems  Musculoskeletal:  Negative for gait problem.  Neurological:  Negative for tremors.  Psychiatric/Behavioral:         Please refer to HPI    Medications: I have reviewed the patient's current medications.  Current Outpatient Medications  Medication Sig Dispense Refill   [START ON 03/13/2022] amphetamine-dextroamphetamine (ADDERALL XR) 30 MG 24 hr capsule Take 1 capsule (30 mg total) by mouth daily. 30 capsule 0   [START ON 04/10/2022] amphetamine-dextroamphetamine (ADDERALL XR) 30 MG 24 hr capsule Take 1 capsule (30 mg total) by mouth daily. 30 capsule 0   amphetamine-dextroamphetamine (ADDERALL XR) 30 MG 24 hr capsule Take 1 capsule (30 mg total) by mouth daily. 30 capsule 0    amphetamine-dextroamphetamine (ADDERALL) 20 MG tablet Take 1 tablet (20 mg total) by mouth daily. 30 tablet 0   cetirizine (ZYRTEC) 10 MG tablet Take 10 mg by mouth daily.     pantoprazole (PROTONIX) 40 MG tablet Take 1 tablet (40 mg total) by mouth daily. 90 tablet 3   No current facility-administered medications for this visit.    Medication Side Effects: None  Allergies: No Known Allergies  Past Medical History:  Diagnosis Date   ADD (attention deficit disorder)    GERD (gastroesophageal reflux disease)     Past Medical History, Surgical history, Social history, and Family history were reviewed and updated as appropriate.   Please see review of systems for further details on the patient's review from today.   Objective:   Physical Exam:  There were no vitals taken for this visit.  Physical Exam Constitutional:      General: He is not in acute distress. Musculoskeletal:        General: No deformity.  Neurological:     Mental Status: He is alert and oriented to person, place, and time.     Coordination: Coordination normal.  Psychiatric:        Attention and Perception: Attention and perception normal. He does not perceive auditory or visual hallucinations.        Mood and Affect: Mood normal. Mood is not anxious or depressed. Affect is not labile, blunt, angry or inappropriate.        Speech: Speech normal.        Behavior: Behavior normal.  Thought Content: Thought content normal. Thought content is not paranoid or delusional. Thought content does not include homicidal or suicidal ideation. Thought content does not include homicidal or suicidal plan.        Cognition and Memory: Cognition and memory normal.        Judgment: Judgment normal.     Comments: Insight intact     Lab Review:     Component Value Date/Time   NA 139 01/22/2021 0734   K 3.8 01/22/2021 0734   CL 103 01/22/2021 0734   CO2 28 01/22/2021 0734   GLUCOSE 82 01/22/2021 0734   BUN 13  01/22/2021 0734   CREATININE 0.90 01/22/2021 0734   CALCIUM 9.3 01/22/2021 0734   PROT 6.6 01/22/2021 0734   ALBUMIN 4.3 01/22/2021 0734   AST 20 01/22/2021 0734   ALT 12 01/22/2021 0734   ALKPHOS 51 01/22/2021 0734   BILITOT 1.4 (H) 01/22/2021 0734       Component Value Date/Time   WBC 5.5 01/22/2021 0734   RBC 4.59 01/22/2021 0734   HGB 14.2 01/22/2021 0734   HCT 40.6 01/22/2021 0734   PLT 209.0 01/22/2021 0734   MCV 88.4 01/22/2021 0734   MCHC 34.9 01/22/2021 0734   RDW 13.6 01/22/2021 0734   LYMPHSABS 1.8 01/22/2021 0734   MONOABS 0.4 01/22/2021 0734   EOSABS 0.2 01/22/2021 0734   BASOSABS 0.1 01/22/2021 0734    No results found for: "POCLITH", "LITHIUM"   No results found for: "PHENYTOIN", "PHENOBARB", "VALPROATE", "CBMZ"   .res Assessment: Plan:    Plan:  1. Adderall XR '30mg'$  once 2. Adderall '20mg'$  daily - 1 script sent  Set up with Rinaldo Cloud  98/82/70  RTC 3 months   Time spent with patient was 10 minutes. Greater than 50% of face to face time with patient was spent on counseling and coordination of care.    Patient advised to contact office with any questions, adverse effects, or acute worsening in signs and symptoms.  Discussed potential benefits, risks, and side effects of stimulants with patient to include increased heart rate, palpitations, insomnia, increased anxiety, increased irritability, or decreased appetite.  Instructed patient to contact office if experiencing any significant tolerability issues. Diagnoses and all orders for this visit:  Attention deficit hyperactivity disorder (ADHD), unspecified ADHD type -     amphetamine-dextroamphetamine (ADDERALL) 20 MG tablet; Take 1 tablet (20 mg total) by mouth daily. -     amphetamine-dextroamphetamine (ADDERALL XR) 30 MG 24 hr capsule; Take 1 capsule (30 mg total) by mouth daily. -     amphetamine-dextroamphetamine (ADDERALL XR) 30 MG 24 hr capsule; Take 1 capsule (30 mg total) by mouth daily. -      amphetamine-dextroamphetamine (ADDERALL XR) 30 MG 24 hr capsule; Take 1 capsule (30 mg total) by mouth daily.     Please see After Visit Summary for patient specific instructions.  No future appointments.   No orders of the defined types were placed in this encounter.   -------------------------------

## 2022-04-29 ENCOUNTER — Encounter: Payer: Self-pay | Admitting: Adult Health

## 2022-04-29 ENCOUNTER — Ambulatory Visit (INDEPENDENT_AMBULATORY_CARE_PROVIDER_SITE_OTHER): Payer: BC Managed Care – PPO | Admitting: Adult Health

## 2022-04-29 VITALS — BP 100/80 | HR 62 | Temp 98.3°F | Ht 70.0 in | Wt 194.0 lb

## 2022-04-29 DIAGNOSIS — K220 Achalasia of cardia: Secondary | ICD-10-CM

## 2022-04-29 DIAGNOSIS — Z0000 Encounter for general adult medical examination without abnormal findings: Secondary | ICD-10-CM

## 2022-04-29 DIAGNOSIS — Z23 Encounter for immunization: Secondary | ICD-10-CM | POA: Diagnosis not present

## 2022-04-29 DIAGNOSIS — D492 Neoplasm of unspecified behavior of bone, soft tissue, and skin: Secondary | ICD-10-CM

## 2022-04-29 DIAGNOSIS — F909 Attention-deficit hyperactivity disorder, unspecified type: Secondary | ICD-10-CM | POA: Diagnosis not present

## 2022-04-29 LAB — LIPID PANEL
Cholesterol: 155 mg/dL (ref 0–200)
HDL: 40.6 mg/dL (ref >39.00)
LDL Cholesterol: 101 mg/dL — ABNORMAL HIGH (ref 0–99)
NonHDL: 114.12
Total CHOL/HDL Ratio: 4
Triglycerides: 64 mg/dL (ref 0.0–149.0)
VLDL: 12.8 mg/dL (ref 0.0–40.0)

## 2022-04-29 LAB — CBC WITH DIFFERENTIAL/PLATELET
Basophils Absolute: 0.1 10*3/uL (ref 0.0–0.1)
Basophils Relative: 1.8 % (ref 0.0–3.0)
Eosinophils Absolute: 0.3 10*3/uL (ref 0.0–0.7)
Eosinophils Relative: 6.2 % — ABNORMAL HIGH (ref 0.0–5.0)
HCT: 41.4 % (ref 39.0–52.0)
Hemoglobin: 13.9 g/dL (ref 13.0–17.0)
Lymphocytes Relative: 38.2 % (ref 12.0–46.0)
Lymphs Abs: 1.7 10*3/uL (ref 0.7–4.0)
MCHC: 33.5 g/dL (ref 30.0–36.0)
MCV: 85.2 fl (ref 78.0–100.0)
Monocytes Absolute: 0.3 10*3/uL (ref 0.1–1.0)
Monocytes Relative: 7.7 % (ref 3.0–12.0)
Neutro Abs: 2 10*3/uL (ref 1.4–7.7)
Neutrophils Relative %: 46.1 % (ref 43.0–77.0)
Platelets: 198 10*3/uL (ref 150.0–400.0)
RBC: 4.86 Mil/uL (ref 4.22–5.81)
RDW: 14.7 % (ref 11.5–15.5)
WBC: 4.4 10*3/uL (ref 4.0–10.5)

## 2022-04-29 LAB — COMPREHENSIVE METABOLIC PANEL
ALT: 11 U/L (ref 0–53)
AST: 18 U/L (ref 0–37)
Albumin: 4.1 g/dL (ref 3.5–5.2)
Alkaline Phosphatase: 56 U/L (ref 39–117)
BUN: 12 mg/dL (ref 6–23)
CO2: 25 mEq/L (ref 19–32)
Calcium: 9.2 mg/dL (ref 8.4–10.5)
Chloride: 105 mEq/L (ref 96–112)
Creatinine, Ser: 0.96 mg/dL (ref 0.40–1.50)
GFR: 97.72 mL/min (ref >60.00)
Glucose, Bld: 86 mg/dL (ref 70–99)
Potassium: 4.1 mEq/L (ref 3.5–5.1)
Sodium: 140 mEq/L (ref 135–145)
Total Bilirubin: 0.7 mg/dL (ref 0.2–1.2)
Total Protein: 7 g/dL (ref 6.0–8.3)

## 2022-04-29 LAB — TSH: TSH: 2.26 u[IU]/mL (ref 0.35–5.50)

## 2022-04-29 NOTE — Progress Notes (Signed)
Subjective:    Patient ID: Riley Cardenas, male    DOB: Jul 09, 1981, 41 y.o.   MRN: [id number]  HPI Patient presents for yearly preventative medicine examination. He is a pleasant 41 year old male who  has a past medical history of ADD (attention deficit disorder) and GERD (gastroesophageal reflux disease).   ADHD -managed with Adderall 30 mg extended release daily and will take a 20 mg IR if needed for longer days.   He feels that this dose works well for him.  This is prescribed by psychiatry   Achalasia of esophageus -was diagnosed in 03/2021.  He had POEM procedure done at [facility] in November 2022. He reports that he is no longer experiencing a cough or dysphagia. He does acid reflux symptoms periodically and will take Protonix when he has symptoms but this is not often.   Skin Neoplasm.  - reports that he has had a non healing small wound on his right nare that he is concerned about. He has a family history of skin cancer with his brother. He does spend time outside and wears sun screen   All immunizations and health maintenance protocols were reviewed with the patient and needed orders were placed.  Appropriate screening laboratory values were ordered for the patient including screening of hyperlipidemia, renal function and hepatic function.   Medication reconciliation,  past medical history, social history, problem list and allergies were reviewed in detail with the patient  Goals were established with regard to weight loss, exercise, and  diet in compliance with medications Wt Readings from Last 3 Encounters:  04/29/22 194 lb (88 kg)  03/25/21 189 lb 8 oz (86 kg)  02/26/21 186 lb (84.4 kg)   Review of Systems  Constitutional: Negative.   HENT: Negative.    Eyes: Negative.   Respiratory: Negative.    Cardiovascular: Negative.   Gastrointestinal: Negative.   Endocrine: Negative.   Genitourinary: Negative.   Musculoskeletal: Negative.   Skin:  Positive for  wound.  Allergic/Immunologic: Negative.   Neurological: Negative.   Hematological: Negative.   Psychiatric/Behavioral: Negative.    All other systems reviewed and are negative.  Past Medical History:  Diagnosis Date   ADD (attention deficit disorder)    GERD (gastroesophageal reflux disease)     Social History   Socioeconomic History   Marital status: Married    Spouse name: Not on file   Number of children: Not on file   Years of education: Not on file   Highest education level: Not on file  Occupational History   Not on file  Tobacco Use   Smoking status: Former   Smokeless tobacco: Never  Vaping Use   Vaping Use: Never used  Substance and Sexual Activity   Alcohol use: Yes    Alcohol/week: 0.0 standard drinks of alcohol    Comment: occ   Drug use: No   Sexual activity: Yes    Partners: Female  Other Topics Concern   Not on file  Social History Narrative   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    Married    2 month old son    Social Determinants of Radio broadcast assistant Strain: Not on file  Food Insecurity: Not on file  Transportation Needs: Not on file  Physical Activity: Not on file  Stress: Not on file  Social Connections: Not on file  Intimate Partner Violence: Not on file    No past surgical history on file.  Family History  Problem Relation Age of Onset   Arthritis Maternal Grandmother    Hypertension Maternal Grandfather    Arthritis Maternal Grandfather    Colon cancer Neg Hx    Esophageal cancer Neg Hx    Rectal cancer Neg Hx    Stomach cancer Neg Hx     No Known Allergies  Current Outpatient Medications on File Prior to Visit  Medication Sig Dispense Refill   amphetamine-dextroamphetamine (ADDERALL XR) 30 MG 24 hr capsule Take 1 capsule (30 mg total) by mouth daily. 30 capsule 0   amphetamine-dextroamphetamine (ADDERALL) 20 MG tablet Take 1 tablet (20 mg total) by mouth daily. 30 tablet 0   cetirizine (ZYRTEC) 10  MG tablet Take 10 mg by mouth daily.     pantoprazole (PROTONIX) 40 MG tablet Take 1 tablet (40 mg total) by mouth daily. 90 tablet 3   No current facility-administered medications on file prior to visit.    BP 100/80   Pulse 62   Temp 98.3 F (36.8 C) (Oral)   Ht '5\' 10"'$  (1.778 m)   Wt 194 lb (88 kg)   SpO2 97%   BMI 27.84 kg/m       Objective:   Physical Exam Vitals and nursing note reviewed.  Constitutional:      General: He is not in acute distress.    Appearance: Normal appearance. He is well-developed and normal weight.  HENT:     Head: Normocephalic and atraumatic.     Right Ear: Tympanic membrane, ear canal and external ear normal. There is no impacted cerumen.     Left Ear: Tympanic membrane, ear canal and external ear normal. There is no impacted cerumen.     Nose: Nose normal. No congestion or rhinorrhea.     Mouth/Throat:     Mouth: Mucous membranes are moist.     Pharynx: Oropharynx is clear. No oropharyngeal exudate or posterior oropharyngeal erythema.  Eyes:     General:        Right eye: No discharge.        Left eye: No discharge.     Extraocular Movements: Extraocular movements intact.     Conjunctiva/sclera: Conjunctivae normal.     Pupils: Pupils are equal, round, and reactive to light.  Neck:     Vascular: No carotid bruit.     Trachea: No tracheal deviation.  Cardiovascular:     Rate and Rhythm: Normal rate and regular rhythm.     Pulses: Normal pulses.     Heart sounds: Normal heart sounds. No murmur heard.    No friction rub. No gallop.  Pulmonary:     Effort: Pulmonary effort is normal. No respiratory distress.     Breath sounds: Normal breath sounds. No stridor. No wheezing, rhonchi or rales.  Chest:     Chest wall: No tenderness.  Abdominal:     General: Bowel sounds are normal. There is no distension.     Palpations: Abdomen is soft. There is no mass.     Tenderness: There is no abdominal tenderness. There is no right CVA tenderness,  left CVA tenderness, guarding or rebound.     Hernia: No hernia is present.  Musculoskeletal:        General: No swelling, tenderness, deformity or signs of injury. Normal range of motion.     Right lower leg: No edema.     Left lower leg: No edema.  Lymphadenopathy:     Cervical: No cervical adenopathy.  Skin:  General: Skin is warm and dry.     Capillary Refill: Capillary refill takes less than 2 seconds.     Coloration: Skin is not jaundiced or pale.     Findings: No bruising, erythema, lesion or rash.     Comments: Sub centimenter raised neoplasm on right nare  Neurological:     General: No focal deficit present.     Mental Status: He is alert and oriented to person, place, and time.     Cranial Nerves: No cranial nerve deficit.     Sensory: No sensory deficit.     Motor: No weakness.     Coordination: Coordination normal.     Gait: Gait normal.     Deep Tendon Reflexes: Reflexes normal.  Psychiatric:        Mood and Affect: Mood normal.        Behavior: Behavior normal.        Thought Content: Thought content normal.        Judgment: Judgment normal.       Assessment & Plan:  1. Routine general medical examination at a health care facility - Follow up in one year or sooner if needed - Work on weight loss through diet and exercise  - CBC with Differential/Platelet; Future - Comprehensive metabolic panel; Future - Lipid panel; Future - TSH; Future  2. Attention deficit hyperactivity disorder (ADHD), unspecified ADHD type - Continue with plan from psychiatry   3. Achalasia - Resolved   4. 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 Need for immunization against influenza  - Flu Vaccine QUAD 32moIM (Fluarix, Fluzone & Alfiuria Quad PF)   CDorothyann Peng NP

## 2022-04-29 NOTE — Patient Instructions (Signed)
It was great seeing you today   We will follow up with you regarding your lab work   Please let me know if you need anything   

## 2022-05-07 ENCOUNTER — Telehealth: Payer: Self-pay

## 2022-05-07 NOTE — Telephone Encounter (Signed)
Prior Authorization submitted for AMPHETAMINE-DEXTROAMPHETAMINE 20 MG approval received effective 05/06/2022-05/06/2025 with [facility].

## 2022-05-08 ENCOUNTER — Telehealth: Payer: Self-pay | Admitting: Adult Health

## 2022-05-08 NOTE — Telephone Encounter (Signed)
Pt LVM @ 10:09a.  He said he was told that a PA was needed for the Adderall XR '30mg'$ .  He knows the other Adderall one was approved, but now one is needed for this dose.  Next appt 9/29

## 2022-05-11 ENCOUNTER — Other Ambulatory Visit: Payer: Self-pay

## 2022-05-11 NOTE — Telephone Encounter (Addendum)
Prior Authorization initiated Adderall XR 30 #30 [facility]  ID: [id number]  Approval  Effective:  05/11/2022 - 05/11/2025

## 2022-05-11 NOTE — Telephone Encounter (Signed)
Initiated PA

## 2022-05-15 ENCOUNTER — Encounter: Payer: Self-pay | Admitting: Adult Health

## 2022-05-15 ENCOUNTER — Ambulatory Visit: Payer: BC Managed Care – PPO | Admitting: Adult Health

## 2022-05-15 DIAGNOSIS — F909 Attention-deficit hyperactivity disorder, unspecified type: Secondary | ICD-10-CM

## 2022-05-15 MED ORDER — AMPHETAMINE-DEXTROAMPHET ER 30 MG PO CP24: 30.0000 mg | ORAL_CAPSULE | Freq: Every day | ORAL | 0 refills | Status: DC

## 2022-05-15 MED ORDER — AMPHETAMINE-DEXTROAMPHETAMINE 20 MG PO TABS: 20.0000 mg | ORAL_TABLET | Freq: Every day | ORAL | 0 refills | Status: DC

## 2022-05-15 NOTE — Progress Notes (Signed)
Riley Cardenas [id number] [birth year] 41 y.o.  Subjective:   Patient ID:  Riley Cardenas is a 41 y.o. (DOB 29-Apr-1981) male.  Chief Complaint: No chief complaint on file.   HPI Taisei Bonnette presents to the office today for follow-up of ADHD.  Describes mood today as "ok". Pleasant. Denies tearfulness. Mood symptoms - denies depression, anxiety, and irritability. Mood is consistent. Stating "I'm doing alright". Feels like medications continue to work well. Stable interest and motivation. Taking medications as prescribed.  Energy levels stable. Active, does not have a regular exercise routine. Enjoys some usual interests and activities. Married. Lives with wife his 2 sons. Spending time with family.  Appetite adequate. Weight gain. Sleeps is consistent. Averages 6 to 8 hours. Focus and concentration stable. Completing tasks. Managing aspects of household. 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  Denies AH or VH. Planning to see a therapist.    [facility] Office Visit from 04/29/2022 in [facility] at [facility] from 01/22/2021 in [facility] at [facility] from 04/12/2019 in [facility] at [facility] Total Score 1 1 0        Review of Systems:  Review of Systems  Musculoskeletal:  Negative for gait problem.  Neurological:  Negative for tremors.  Psychiatric/Behavioral:         Please refer to HPI    Medications: I have reviewed the patient's current medications.  Current Outpatient Medications  Medication Sig Dispense Refill   [START ON 06/12/2022] amphetamine-dextroamphetamine (ADDERALL XR) 30 MG 24 hr capsule Take 1 capsule (30 mg total) by mouth daily. 30 capsule 0   [START ON 07/10/2022] amphetamine-dextroamphetamine (ADDERALL XR) 30 MG 24 hr capsule Take 1 capsule (30 mg total) by mouth daily. 30 capsule 0   amphetamine-dextroamphetamine (ADDERALL XR) 30 MG 24 hr capsule Take 1 capsule (30 mg total) by mouth  daily. 30 capsule 0   amphetamine-dextroamphetamine (ADDERALL) 20 MG tablet Take 1 tablet (20 mg total) by mouth daily. 30 tablet 0   cetirizine (ZYRTEC) 10 MG tablet Take 10 mg by mouth daily.     pantoprazole (PROTONIX) 40 MG tablet Take 1 tablet (40 mg total) by mouth daily. 90 tablet 3   No current facility-administered medications for this visit.    Medication Side Effects: None  Allergies: No Known Allergies  Past Medical History:  Diagnosis Date   ADD (attention deficit disorder)    GERD (gastroesophageal reflux disease)     Past Medical History, Surgical history, Social history, and Family history were reviewed and updated as appropriate.   Please see review of systems for further details on the patient's review from today.   Objective:   Physical Exam:  There were no vitals taken for this visit.  Physical Exam Constitutional:      General: He is not in acute distress. Musculoskeletal:        General: No deformity.  Neurological:     Mental Status: He is alert and oriented to person, place, and time.     Coordination: Coordination normal.  Psychiatric:        Attention and Perception: Attention and perception normal. He does not perceive auditory or visual hallucinations.        Mood and Affect: Mood normal. Mood is not anxious or depressed. Affect is not labile, blunt, angry or inappropriate.        Speech: Speech normal.        Behavior: Behavior normal.  Thought Content: Thought content normal. Thought content is not paranoid or delusional. Thought content does not include homicidal or suicidal ideation. Thought content does not include homicidal or suicidal plan.        Cognition and Memory: Cognition and memory normal.        Judgment: Judgment normal.     Comments: Insight intact     Lab Review:     Component Value Date/Time   NA 140 04/29/2022 0829   K 4.1 04/29/2022 0829   CL 105 04/29/2022 0829   CO2 25 04/29/2022 0829   GLUCOSE 86  04/29/2022 0829   BUN 12 04/29/2022 0829   CREATININE 0.96 04/29/2022 0829   CALCIUM 9.2 04/29/2022 0829   PROT 7.0 04/29/2022 0829   ALBUMIN 4.1 04/29/2022 0829   AST 18 04/29/2022 0829   ALT 11 04/29/2022 0829   ALKPHOS 56 04/29/2022 0829   BILITOT 0.7 04/29/2022 0829       Component Value Date/Time   WBC 4.4 04/29/2022 0829   RBC 4.86 04/29/2022 0829   HGB 13.9 04/29/2022 0829   HCT 41.4 04/29/2022 0829   PLT 198.0 04/29/2022 0829   MCV 85.2 04/29/2022 0829   MCHC 33.5 04/29/2022 0829   RDW 14.7 04/29/2022 0829   LYMPHSABS 1.7 04/29/2022 0829   MONOABS 0.3 04/29/2022 0829   EOSABS 0.3 04/29/2022 0829   BASOSABS 0.1 04/29/2022 0829    No results found for: "POCLITH", "LITHIUM"   No results found for: "PHENYTOIN", "PHENOBARB", "VALPROATE", "CBMZ"   .res Assessment: Plan:    Plan:  1. Adderall XR '30mg'$  once 2. Adderall '20mg'$  daily - 1 script sent  Set up with Rinaldo Cloud  98/82/70  RTC 3 months   Time spent with patient was 10 minutes. Greater than 50% of face to face time with patient was spent on counseling and coordination of care.    Patient advised to contact office with any questions, adverse effects, or acute worsening in signs and symptoms.  Discussed potential benefits, risks, and side effects of stimulants with patient to include increased heart rate, palpitations, insomnia, increased anxiety, increased irritability, or decreased appetite.  Instructed patient to contact office if experiencing any significant tolerability issues.  Diagnoses and all orders for this visit:  Attention deficit hyperactivity disorder (ADHD), unspecified ADHD type -     amphetamine-dextroamphetamine (ADDERALL XR) 30 MG 24 hr capsule; Take 1 capsule (30 mg total) by mouth daily. -     amphetamine-dextroamphetamine (ADDERALL) 20 MG tablet; Take 1 tablet (20 mg total) by mouth daily. -     amphetamine-dextroamphetamine (ADDERALL XR) 30 MG 24 hr capsule; Take 1 capsule (30 mg  total) by mouth daily. -     amphetamine-dextroamphetamine (ADDERALL XR) 30 MG 24 hr capsule; Take 1 capsule (30 mg total) by mouth daily.     Please see After Visit Summary for patient specific instructions.  No future appointments.   No orders of the defined types were placed in this encounter.   -------------------------------

## 2022-08-19 ENCOUNTER — Ambulatory Visit (INDEPENDENT_AMBULATORY_CARE_PROVIDER_SITE_OTHER): Payer: Self-pay | Admitting: Adult Health

## 2022-08-19 ENCOUNTER — Encounter: Payer: Self-pay | Admitting: Adult Health

## 2022-08-19 DIAGNOSIS — F489 Nonpsychotic mental disorder, unspecified: Secondary | ICD-10-CM

## 2022-08-19 NOTE — Progress Notes (Signed)
Patient no show appointment. ? ?

## 2022-08-21 ENCOUNTER — Other Ambulatory Visit: Payer: Self-pay | Admitting: Adult Health

## 2022-08-21 DIAGNOSIS — F909 Attention-deficit hyperactivity disorder, unspecified type: Secondary | ICD-10-CM

## 2022-08-21 MED ORDER — AMPHETAMINE-DEXTROAMPHET ER 30 MG PO CP24: 30.0000 mg | ORAL_CAPSULE | Freq: Every day | ORAL | 0 refills | Status: DC

## 2022-09-03 ENCOUNTER — Ambulatory Visit: Payer: BC Managed Care – PPO | Admitting: Adult Health

## 2022-10-08 ENCOUNTER — Encounter: Payer: Self-pay | Admitting: Adult Health

## 2022-11-20 ENCOUNTER — Ambulatory Visit: Payer: BC Managed Care – PPO | Admitting: Adult Health

## 2022-11-20 ENCOUNTER — Encounter: Payer: Self-pay | Admitting: Adult Health

## 2022-11-20 DIAGNOSIS — F909 Attention-deficit hyperactivity disorder, unspecified type: Secondary | ICD-10-CM | POA: Diagnosis not present

## 2022-11-20 MED ORDER — AMPHETAMINE-DEXTROAMPHET ER 30 MG PO CP24: 30.0000 mg | ORAL_CAPSULE | Freq: Every day | ORAL | 0 refills | Status: DC

## 2022-11-20 NOTE — Progress Notes (Signed)
Riley BullaDavid Cardenas [id number] [birth year] 42 y.o.  Subjective:   Patient ID:  Riley Cardenas is a 42 y.o. (DOB [birth year]) male.  Chief Complaint: No chief complaint on file.   HPI Riley Cardenas presents to the office today for follow-up of ADHD.  Describes mood today as "ok". Pleasant. Denies tearfulness. Mood symptoms - denies depression, anxiety, and irritability. Mood is consistent. Stating "I'm doing alright". Feels like medications continue to work well. Stable interest and motivation. Taking medications as prescribed.  Energy levels stable. Active, does not have a regular exercise routine. Enjoys some usual interests and activities. Married. Lives with wife his sons. Spending time with family.  Appetite adequate. Weight gain. Sleeps is consistent. Averages 6 to 8 hours. Focus and concentration stable. Completing tasks. Managing aspects of household. Works full-time as a Runner, broadcasting/film/videoteacher. Denies SI or HI.  Denies AH or VH.     PHQ2-9    Flowsheet Row [facility]  PHQ-2 Total Score 1 1 0        Review of Systems:  Review of Systems  Musculoskeletal:  Negative for gait problem.  Neurological:  Negative for tremors.  Psychiatric/Behavioral:         Please refer to HPI    Medications: I have reviewed the patient's current medications.  Current Outpatient Medications  Medication Sig Dispense Refill   amphetamine-dextroamphetamine (ADDERALL XR) 30 MG 24 hr capsule Take 1 capsule (30 mg total) by mouth daily. 30 capsule 0   amphetamine-dextroamphetamine (ADDERALL XR) 30 MG 24 hr capsule Take 1 capsule (30 mg total) by mouth daily. 30 capsule 0   amphetamine-dextroamphetamine (ADDERALL XR) 30 MG 24 hr capsule Take 1 capsule (30 mg total) by mouth daily. 30 capsule 0    amphetamine-dextroamphetamine (ADDERALL) 20 MG tablet Take 1 tablet (20 mg total) by mouth daily. 30 tablet 0   cetirizine (ZYRTEC) 10 MG tablet Take 10 mg by mouth daily.     pantoprazole (PROTONIX) 40 MG tablet Take 1 tablet (40 mg total) by mouth daily. 90 tablet 3   No current facility-administered medications for this visit.    Medication Side Effects: None  Allergies: No Known Allergies  Past Medical History:  Diagnosis Date   ADD (attention deficit disorder)    GERD (gastroesophageal reflux disease)     Past Medical History, Surgical history, Social history, and Family history were reviewed and updated as appropriate.   Please see review of systems for further details on the patient's review from today.   Objective:   Physical Exam:  There were no vitals taken for this visit.  Physical Exam Constitutional:      General: He is not in acute distress. Musculoskeletal:        General: No deformity.  Neurological:     Mental Status: He is alert and oriented to person, place, and time.     Coordination: Coordination normal.  Psychiatric:        Attention and Perception: Attention and perception normal. He does not perceive auditory or visual hallucinations.        Mood and Affect: Mood normal. Mood is not anxious or depressed. Affect is not labile, blunt, angry or inappropriate.        Speech: Speech normal.        Behavior: Behavior normal.  Thought Content: Thought content normal. Thought content is not paranoid or delusional. Thought content does not include homicidal or suicidal ideation. Thought content does not include homicidal or suicidal plan.        Cognition and Memory: Cognition and memory normal.        Judgment: Judgment normal.     Comments: Insight intact     Lab Review:     Component Value Date/Time   NA 140 04/29/2022 0829   K 4.1 04/29/2022 0829   CL 105 04/29/2022 0829   CO2 25 04/29/2022 0829   GLUCOSE 86 04/29/2022 0829   BUN 12  04/29/2022 0829   CREATININE 0.96 04/29/2022 0829   CALCIUM 9.2 04/29/2022 0829   PROT 7.0 04/29/2022 0829   ALBUMIN 4.1 04/29/2022 0829   AST 18 04/29/2022 0829   ALT 11 04/29/2022 0829   ALKPHOS 56 04/29/2022 0829   BILITOT 0.7 04/29/2022 0829       Component Value Date/Time   WBC 4.4 04/29/2022 0829   RBC 4.86 04/29/2022 0829   HGB 13.9 04/29/2022 0829   HCT 41.4 04/29/2022 0829   PLT 198.0 04/29/2022 0829   MCV 85.2 04/29/2022 0829   MCHC 33.5 04/29/2022 0829   RDW 14.7 04/29/2022 0829   LYMPHSABS 1.7 04/29/2022 0829   MONOABS 0.3 04/29/2022 0829   EOSABS 0.3 04/29/2022 0829   BASOSABS 0.1 04/29/2022 0829    No results found for: "POCLITH", "LITHIUM"   No results found for: "PHENYTOIN", "PHENOBARB", "VALPROATE", "CBMZ"   .res Assessment: Plan:    Plan:  1. Adderall XR 30mg  once 2. Adderall 20mg  daily - 1 script sent  98/82/70  RTC 3 months  Time spent with patient was 10 minutes. Greater than 50% of face to face time with patient was spent on counseling and coordination of care.    Patient advised to contact office with any questions, adverse effects, or acute worsening in signs and symptoms.  Discussed potential benefits, risks, and side effects of stimulants with patient to include increased heart rate, palpitations, insomnia, increased anxiety, increased irritability, or decreased appetite.  Instructed patient to contact office if experiencing any significant tolerability issues. There are no diagnoses linked to this encounter.   Please see After Visit Summary for patient specific instructions.  Future Appointments  Date Time Provider Department Center  11/20/2022  8:00 AM [place], Thereasa Solo, NP [facility] None    No orders of the defined types were placed in this encounter.   -------------------------------

## 2022-12-01 ENCOUNTER — Emergency Department (HOSPITAL_BASED_OUTPATIENT_CLINIC_OR_DEPARTMENT_OTHER): Payer: BC Managed Care – PPO | Admitting: Radiology

## 2022-12-01 ENCOUNTER — Emergency Department (HOSPITAL_BASED_OUTPATIENT_CLINIC_OR_DEPARTMENT_OTHER)
Admission: EM | Admit: 2022-12-01 | Discharge: 2022-12-01 | Disposition: A | Discharge status: Home / Self Care | Payer: BC Managed Care – PPO | Attending: Emergency Medicine | Admitting: Emergency Medicine

## 2022-12-01 ENCOUNTER — Encounter (HOSPITAL_BASED_OUTPATIENT_CLINIC_OR_DEPARTMENT_OTHER): Payer: Self-pay

## 2022-12-01 ENCOUNTER — Other Ambulatory Visit: Payer: Self-pay

## 2022-12-01 DIAGNOSIS — S50812A Abrasion of left forearm, initial encounter: Secondary | ICD-10-CM | POA: Insufficient documentation

## 2022-12-01 DIAGNOSIS — Y92410 Unspecified street and highway as the place of occurrence of the external cause: Secondary | ICD-10-CM | POA: Diagnosis not present

## 2022-12-01 DIAGNOSIS — R0781 Pleurodynia: Secondary | ICD-10-CM | POA: Insufficient documentation

## 2022-12-01 MED ORDER — LIDOCAINE 5 % EX PTCH
1.0000 | MEDICATED_PATCH | CUTANEOUS | Status: DC
  Administered 2022-12-01: 1 via TRANSDERMAL
  Filled 2022-12-01: qty 1
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
## 2022-12-01 NOTE — Discharge Instructions (Signed)
You are seen today in the emergency department due to abdominal pain.  The x-ray was reassuringly normal.  I suspect this most likely muscle strain, you can take Tylenol and Motrin as needed for pain, can also use Lidoderm patches if those help.  If you have any new or concerning symptoms such as coughing up blood, severe abdominal pain, loss of bladder or bowel function, peeing blood, severe midline back pain, weakness to your lower extremities or numbness in your groin you need to return to the ED for further evaluation.
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
## 2022-12-10 ENCOUNTER — Encounter: Payer: Self-pay | Admitting: Adult Health

## 2022-12-10 ENCOUNTER — Ambulatory Visit: Payer: BC Managed Care – PPO | Admitting: Adult Health

## 2022-12-10 VITALS — BP 120/88 | HR 60 | Temp 97.4°F | Ht 70.0 in | Wt 210.0 lb

## 2022-12-10 DIAGNOSIS — K5900 Constipation, unspecified: Secondary | ICD-10-CM

## 2022-12-10 DIAGNOSIS — T148XXA Other injury of unspecified body region, initial encounter: Secondary | ICD-10-CM

## 2022-12-10 DIAGNOSIS — R1032 Left lower quadrant pain: Secondary | ICD-10-CM

## 2022-12-10 MED ORDER — METHYLPREDNISOLONE 4 MG PO TBPK
ORAL_TABLET | ORAL | 0 refills | Status: DC
Start: 2022-12-10 — End: 2023-03-23

## 2022-12-10 MED ORDER — CYCLOBENZAPRINE HCL 10 MG PO TABS: 10.0000 mg | ORAL_TABLET | Freq: Every day | ORAL | 0 refills | Status: DC
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
## 2022-12-14 ENCOUNTER — Encounter (HOSPITAL_BASED_OUTPATIENT_CLINIC_OR_DEPARTMENT_OTHER): Payer: Self-pay | Admitting: Radiology

## 2022-12-15 ENCOUNTER — Ambulatory Visit (HOSPITAL_BASED_OUTPATIENT_CLINIC_OR_DEPARTMENT_OTHER)
Admission: RE | Admit: 2022-12-15 | Discharge: 2022-12-15 | Disposition: A | Discharge status: Home / Self Care | Payer: BC Managed Care – PPO | Source: Ambulatory Visit | Attending: Adult Health | Admitting: Adult Health

## 2022-12-15 ENCOUNTER — Encounter (HOSPITAL_BASED_OUTPATIENT_CLINIC_OR_DEPARTMENT_OTHER): Payer: Self-pay

## 2022-12-15 DIAGNOSIS — R1032 Left lower quadrant pain: Secondary | ICD-10-CM

## 2022-12-15 DIAGNOSIS — K5900 Constipation, unspecified: Secondary | ICD-10-CM | POA: Diagnosis present

## 2022-12-15 MED ORDER — IOHEXOL 300 MG/ML  SOLN
100.0000 mL | Freq: Once | INTRAMUSCULAR | Status: AC | PRN
  Administered 2022-12-15: 80 mL via INTRAVENOUS

## 2022-12-24 ENCOUNTER — Encounter: Payer: Self-pay | Admitting: Adult Health

## 2022-12-25 NOTE — Telephone Encounter (Signed)
Please advise 

## 2023-03-18 ENCOUNTER — Encounter: Payer: Self-pay | Admitting: Adult Health
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
## 2023-03-23 ENCOUNTER — Ambulatory Visit: Payer: BC Managed Care – PPO | Admitting: Adult Health

## 2023-03-23 VITALS — BP 110/70 | HR 75 | Temp 98.6°F | Ht 70.0 in | Wt 209.0 lb

## 2023-03-23 DIAGNOSIS — M25512 Pain in left shoulder: Secondary | ICD-10-CM | POA: Diagnosis not present

## 2023-03-23 MED ORDER — METHYLPREDNISOLONE ACETATE 80 MG/ML IJ SUSP
80.0000 mg | Freq: Once | INTRAMUSCULAR | Status: AC
Start: 2023-03-23 — End: 2023-03-23
  Administered 2023-03-23: 80 mg via INTRA_ARTICULAR

## 2023-03-23 NOTE — Patient Instructions (Signed)
Health Maintenance Due  Topic Date Due   COVID-19 Vaccine (1 - 2023-24 season) Never done   INFLUENZA VACCINE  03/18/2023       04/29/2022    8:01 AM 01/22/2021    7:06 AM 04/12/2019    7:26 AM  Depression screen PHQ 2/9  Decreased Interest 0 0 0  Down, Depressed, Hopeless 1 1 0  PHQ - 2 Score 1 1 0
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
## 2023-03-24 ENCOUNTER — Encounter: Payer: Self-pay | Admitting: Adult Health

## 2023-03-24 ENCOUNTER — Ambulatory Visit: Payer: BC Managed Care – PPO | Admitting: Adult Health

## 2023-03-24 DIAGNOSIS — F909 Attention-deficit hyperactivity disorder, unspecified type: Secondary | ICD-10-CM

## 2023-03-24 MED ORDER — AMPHETAMINE-DEXTROAMPHETAMINE 20 MG PO TABS
20.0000 mg | ORAL_TABLET | Freq: Every day | ORAL | 0 refills | Status: DC
Start: 2023-03-24 — End: 2023-06-24

## 2023-03-24 MED ORDER — AMPHETAMINE-DEXTROAMPHET ER 30 MG PO CP24
30.0000 mg | ORAL_CAPSULE | Freq: Every day | ORAL | 0 refills | Status: DC
Start: 2023-04-21 — End: 2023-06-24

## 2023-03-24 MED ORDER — AMPHETAMINE-DEXTROAMPHET ER 30 MG PO CP24
30.0000 mg | ORAL_CAPSULE | Freq: Every day | ORAL | 0 refills | Status: DC
Start: 2023-03-24 — End: 2023-06-24

## 2023-03-24 MED ORDER — AMPHETAMINE-DEXTROAMPHET ER 30 MG PO CP24
30.0000 mg | ORAL_CAPSULE | Freq: Every day | ORAL | 0 refills | Status: DC
Start: 2023-05-19 — End: 2023-06-24

## 2023-03-24 NOTE — Progress Notes (Signed)
Riley Cardenas [id number] 1981/03/15 41 y.o.  Subjective:   Patient ID:  Riley Cardenas is a 42 y.o. (DOB May 28, 1981) male.  Chief Complaint: No chief complaint on file.   HPI Riley Cardenas presents to the office today for follow-up of ADHD.  Describes mood today as "ok". Pleasant. Denies tearfulness. Mood symptoms - denies depression, anxiety, and irritability. Mood is consistent. Stating "I feel like I'm doing alright". Feels like medications continue to work well. Stable interest and motivation. Taking medications as prescribed.  Energy levels stable. Active, does not have a regular exercise routine. Enjoys some usual interests and activities. Married. Lives with wife his sons. Spending time with family.  Appetite adequate. Weight gain. Sleeps is consistent. Averages 6 to 8 hours. Focus and concentration stable. Completing tasks. Managing aspects of household. 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  Denies AH or VH.     AUDIT    Flowsheet Row Office Visit from 03/23/2023 in [facility] [place]  Alcohol Use Disorder Identification Test Final Score (AUDIT) 3       PHQ2-9    Flowsheet Row Office Visit from 04/29/2022 in [facility] [place] Office Visit from 01/22/2021 in [facility] [place] Office Visit from 04/12/2019 in [facility] [place]  PHQ-2 Total Score 1 1 0      Flowsheet Row ED from 12/01/2022 in [facility] at [place] [facility]  C-SSRS RISK CATEGORY No Risk        Review of Systems:  Review of Systems  Musculoskeletal:  Negative for gait problem.  Neurological:  Negative for tremors.  Psychiatric/Behavioral:         Please refer to HPI    Medications: I have reviewed the patient's current medications.  Current Outpatient Medications  Medication Sig Dispense Refill   amphetamine-dextroamphetamine (ADDERALL XR) 30 MG 24 hr capsule Take 1  capsule (30 mg total) by mouth daily. 30 capsule 0   amphetamine-dextroamphetamine (ADDERALL XR) 30 MG 24 hr capsule Take 1 capsule (30 mg total) by mouth daily. 30 capsule 0   amphetamine-dextroamphetamine (ADDERALL XR) 30 MG 24 hr capsule Take 1 capsule (30 mg total) by mouth daily. 30 capsule 0   amphetamine-dextroamphetamine (ADDERALL) 20 MG tablet Take 1 tablet (20 mg total) by mouth daily. 30 tablet 0   cetirizine (ZYRTEC) 10 MG tablet Take 10 mg by mouth daily.     pantoprazole (PROTONIX) 40 MG tablet Take 1 tablet (40 mg total) by mouth daily. 90 tablet 3   No current facility-administered medications for this visit.    Medication Side Effects: None  Allergies: No Known Allergies  Past Medical History:  Diagnosis Date   ADD (attention deficit disorder)    GERD (gastroesophageal reflux disease)     Past Medical History, Surgical history, Social history, and Family history were reviewed and updated as appropriate.   Please see review of systems for further details on the patient's review from today.   Objective:   Physical Exam:  There were no vitals taken for this visit.  Physical Exam Constitutional:      General: He is not in acute distress. Musculoskeletal:        General: No deformity.  Neurological:     Mental Status: He is alert and oriented to person, place, and time.     Coordination: Coordination normal.  Psychiatric:        Attention and Perception: Attention and perception normal.  He does not perceive auditory or visual hallucinations.        Mood and Affect: Affect is not labile, blunt, angry or inappropriate.        Speech: Speech normal.        Behavior: Behavior normal.        Thought Content: Thought content normal. Thought content is not paranoid or delusional. Thought content does not include homicidal or suicidal ideation. Thought content does not include homicidal or suicidal plan.        Cognition and Memory: Cognition and memory normal.         Judgment: Judgment normal.     Comments: Insight intact     Lab Review:     Component Value Date/Time   NA 140 04/29/2022 0829   K 4.1 04/29/2022 0829   CL 105 04/29/2022 0829   CO2 25 04/29/2022 0829   GLUCOSE 86 04/29/2022 0829   BUN 12 04/29/2022 0829   CREATININE 0.96 04/29/2022 0829   CALCIUM 9.2 04/29/2022 0829   PROT 7.0 04/29/2022 0829   ALBUMIN 4.1 04/29/2022 0829   AST 18 04/29/2022 0829   ALT 11 04/29/2022 0829   ALKPHOS 56 04/29/2022 0829   BILITOT 0.7 04/29/2022 0829       Component Value Date/Time   WBC 4.4 04/29/2022 0829   RBC 4.86 04/29/2022 0829   HGB 13.9 04/29/2022 0829   HCT 41.4 04/29/2022 0829   PLT 198.0 04/29/2022 0829   MCV 85.2 04/29/2022 0829   MCHC 33.5 04/29/2022 0829   RDW 14.7 04/29/2022 0829   LYMPHSABS 1.7 04/29/2022 0829   MONOABS 0.3 04/29/2022 0829   EOSABS 0.3 04/29/2022 0829   BASOSABS 0.1 04/29/2022 0829    No results found for: "POCLITH", "LITHIUM"   No results found for: "PHENYTOIN", "PHENOBARB", "VALPROATE", "CBMZ"   .res Assessment: Plan:    Plan:  1. Adderall XR 30mg  once 2. Adderall 20mg  daily - 1 script sent  110/80  RTC 3 months  Time spent with patient was 10 minutes. Greater than 50% of face to face time with patient was spent on counseling and coordination of care.    Patient advised to contact office with any questions, adverse effects, or acute worsening in signs and symptoms.  Discussed potential benefits, risks, and side effects of stimulants with patient to include increased heart rate, palpitations, insomnia, increased anxiety, increased irritability, or decreased appetite.  Instructed patient to contact office if experiencing any significant tolerability issues.  There are no diagnoses linked to this encounter.   Please see After Visit Summary for patient specific instructions.  Future Appointments  Date Time Provider Department Center  03/24/2023  8:40 AM Kamisha Ell, Thereasa Solo, NP [facility]  None    No orders of the defined types were placed in this encounter.   -------------------------------

## 2023-03-26 NOTE — Telephone Encounter (Signed)
Please advise 

## 2023-04-01 ENCOUNTER — Other Ambulatory Visit: Payer: Self-pay | Admitting: Adult Health

## 2023-04-01 DIAGNOSIS — M25512 Pain in left shoulder: Secondary | ICD-10-CM

## 2023-04-07 ENCOUNTER — Other Ambulatory Visit: Payer: BC Managed Care – PPO

## 2023-04-07 ENCOUNTER — Inpatient Hospital Stay: Admission: RE | Admit: 2023-04-07 | Payer: BC Managed Care – PPO | Source: Ambulatory Visit

## 2023-04-20 ENCOUNTER — Other Ambulatory Visit: Payer: Self-pay | Admitting: Adult Health

## 2023-04-20 MED ORDER — NAPROXEN 500 MG PO TABS: 500.0000 mg | ORAL_TABLET | Freq: Two times a day (BID) | ORAL | 1 refills | Status: AC

## 2023-04-29 ENCOUNTER — Other Ambulatory Visit: Payer: Self-pay | Admitting: Adult Health

## 2023-04-29 MED ORDER — TRAMADOL HCL 50 MG PO TABS: 50.0000 mg | ORAL_TABLET | Freq: Every evening | ORAL | 0 refills | Status: AC | PRN

## 2023-05-03 ENCOUNTER — Other Ambulatory Visit (HOSPITAL_COMMUNITY): Payer: Self-pay

## 2023-05-03 ENCOUNTER — Telehealth: Payer: Self-pay

## 2023-05-03 NOTE — Telephone Encounter (Signed)
Pharmacy Patient Advocate Encounter   Received notification from CoverMyMeds that prior authorization for TRAMADOL 50MG  is required/requested.   Insurance verification completed.   The patient is insured through [facility] .   Per test claim: PA required; PA submitted to [employer] via CoverMyMeds Key/confirmation #/EOC [id number] Status is pending

## 2023-05-03 NOTE — Telephone Encounter (Signed)
Pharmacy Patient Advocate Encounter  Received notification from [facility] that Prior Authorization for traMADol HCl 50MG  has been APPROVED from 05/03/23 to 10/30/23   PA #/Case ID/Reference #:  [id number]

## 2023-05-17 ENCOUNTER — Ambulatory Visit
Admission: RE | Admit: 2023-05-17 | Discharge: 2023-05-17 | Disposition: A | Discharge status: Home / Self Care | Payer: BC Managed Care – PPO | Source: Ambulatory Visit | Attending: Adult Health | Admitting: Adult Health

## 2023-05-17 ENCOUNTER — Ambulatory Visit
Admission: RE | Admit: 2023-05-17 | Discharge: 2023-05-17 | Disposition: A | Discharge status: Home / Self Care | Payer: BC Managed Care – PPO | Source: Ambulatory Visit | Attending: Adult Health

## 2023-05-17 DIAGNOSIS — M25512 Pain in left shoulder: Secondary | ICD-10-CM

## 2023-05-17 MED ORDER — IOPAMIDOL (ISOVUE-M 200) INJECTION 41%
10.0000 mL | Freq: Once | INTRAMUSCULAR | Status: AC
  Administered 2023-05-17: 10 mL via INTRA_ARTICULAR

## 2023-06-08 ENCOUNTER — Other Ambulatory Visit: Payer: Self-pay | Admitting: Adult Health

## 2023-06-08 DIAGNOSIS — M25512 Pain in left shoulder: Secondary | ICD-10-CM

## 2023-06-18 IMAGING — RF DG ESOPHAGUS
8 of 10 series · 12 of 24 positions shown · non-contrast
Comparison: CT chest abdomen pelvis 03/04/2021

CLINICAL DATA: GERD.  Rule out achalasia

EXAM:
ESOPHOGRAM / BARIUM SWALLOW / BARIUM TABLET STUDY
TECHNIQUE: Combined double contrast and single contrast examination performed
using effervescent crystals, thick barium liquid, and thin barium
liquid. The patient was observed with fluoroscopy swallowing a 13 mm
barium sulphate tablet.
FLUOROSCOPY TIME:  Fluoroscopy Time:  1 minutes 42 second
Radiation Exposure Index (if provided by the fluoroscopic device):
Number of Acquired Spot Images: 10

[Series 2: cp_standard · 0.55mm/px · 2 of 85 frames shown (1 of 6)]
[frame 13/85]
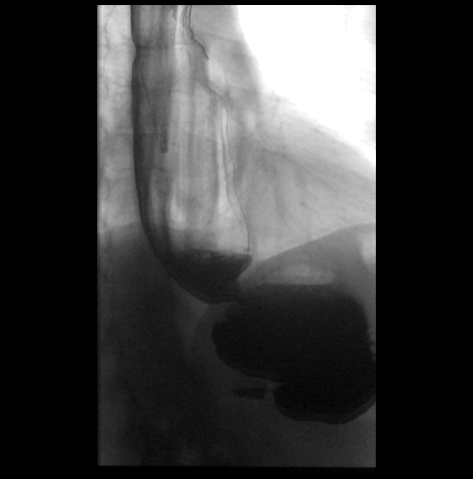
[frame 43/85]
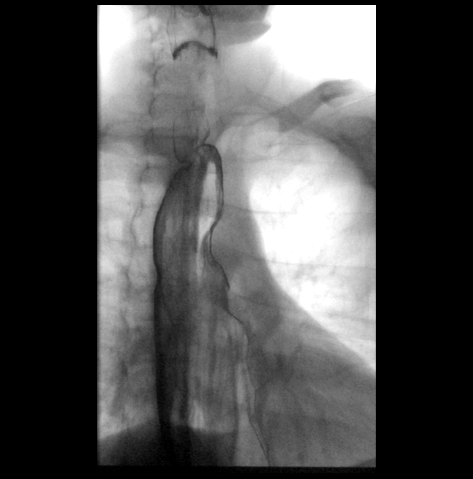

[Series 3: fluoro_barium 2fps_bw · 0.18mm/px · 1 of 2 frames shown (1 of 2)]
[frame 2/2]
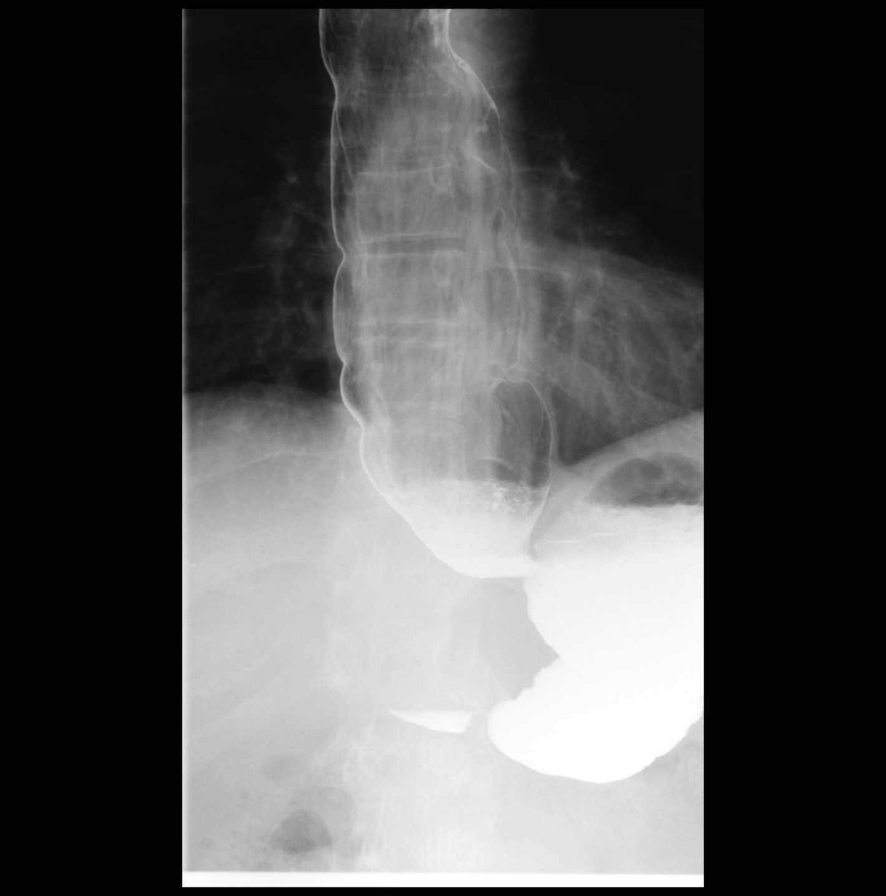

[Series 4: cp_standard · 0.40mm/px · 2 of 152 frames shown (2 of 6)]
[frame 77/152]
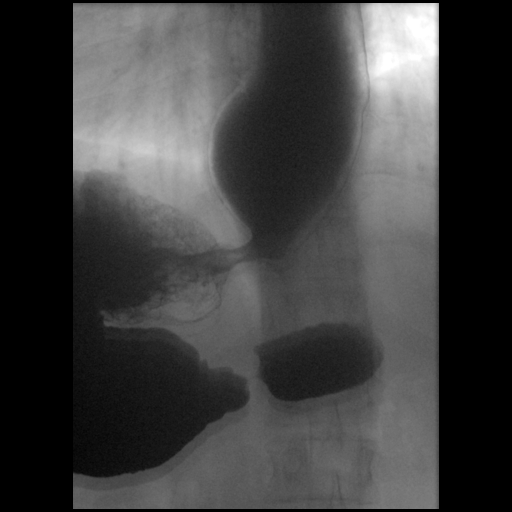
[frame 130/152]
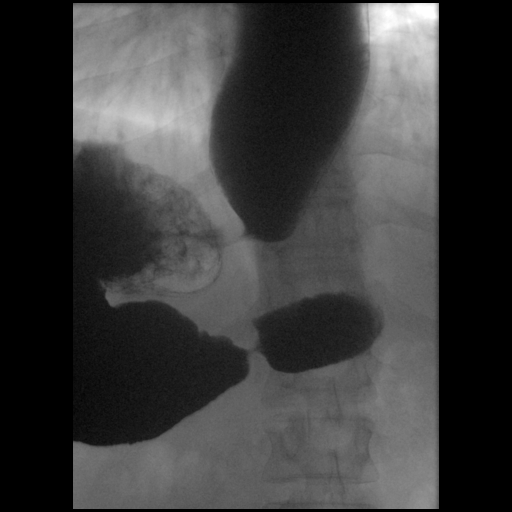

[Series 5: cp_standard · 0.40mm/px · 1 of 58 frames shown (3 of 6)]
[frame 30/58]
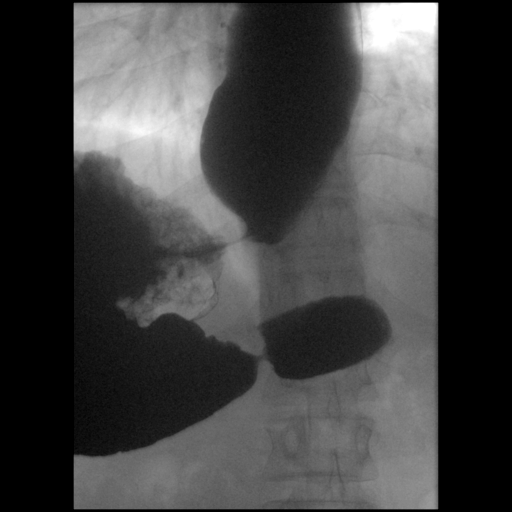

[Series 6: cp_standard · 0.40mm/px · 2 of 71 frames shown (4 of 6)]
[frame 1/71]
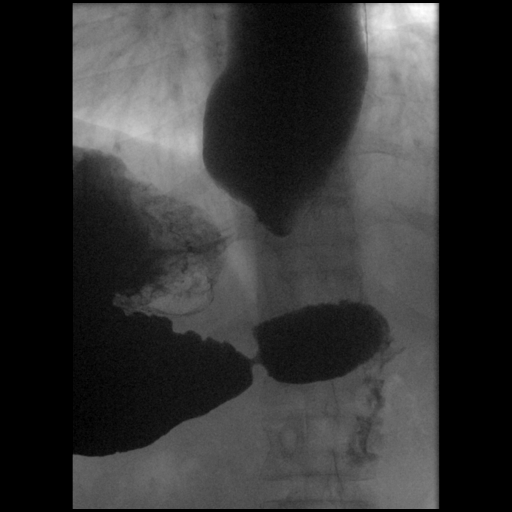
[frame 36/71]
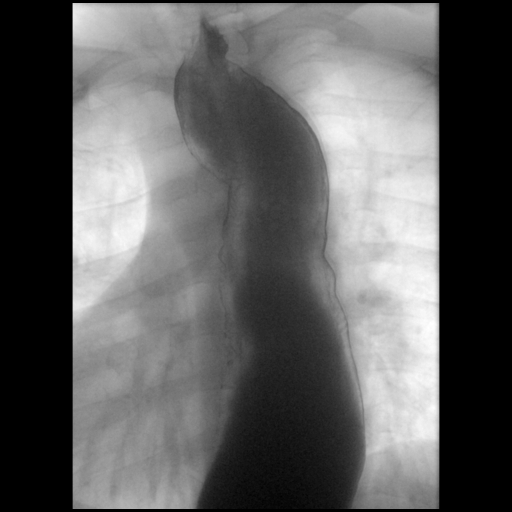

[Series 7: cp_standard · 0.40mm/px · 2 of 110 frames shown (5 of 6)]
[frame 17/110]
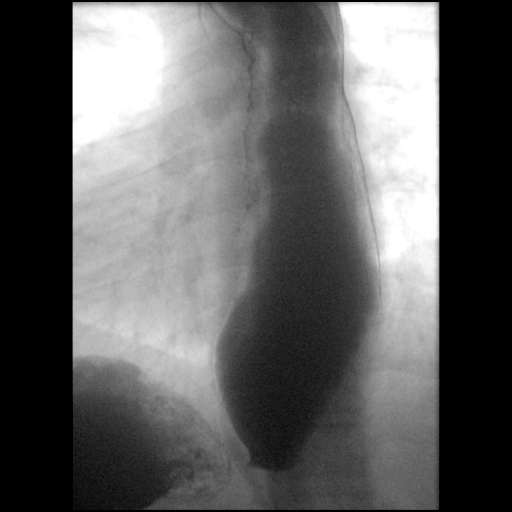
[frame 78/110]
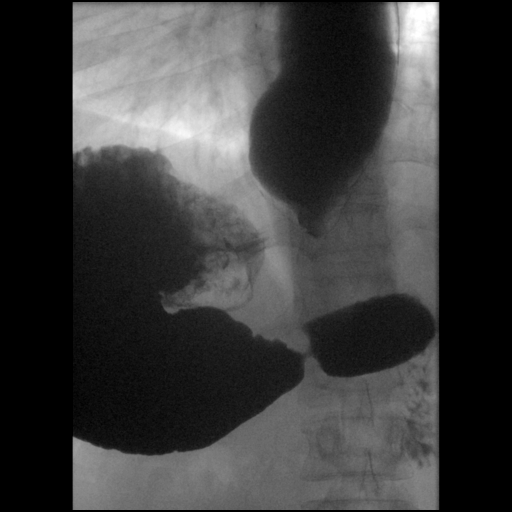

[Series 8: fluoro_barium 2fps_bw · 0.18mm/px · 1 of 2 frames shown (2 of 2)]
[frame 2/2]
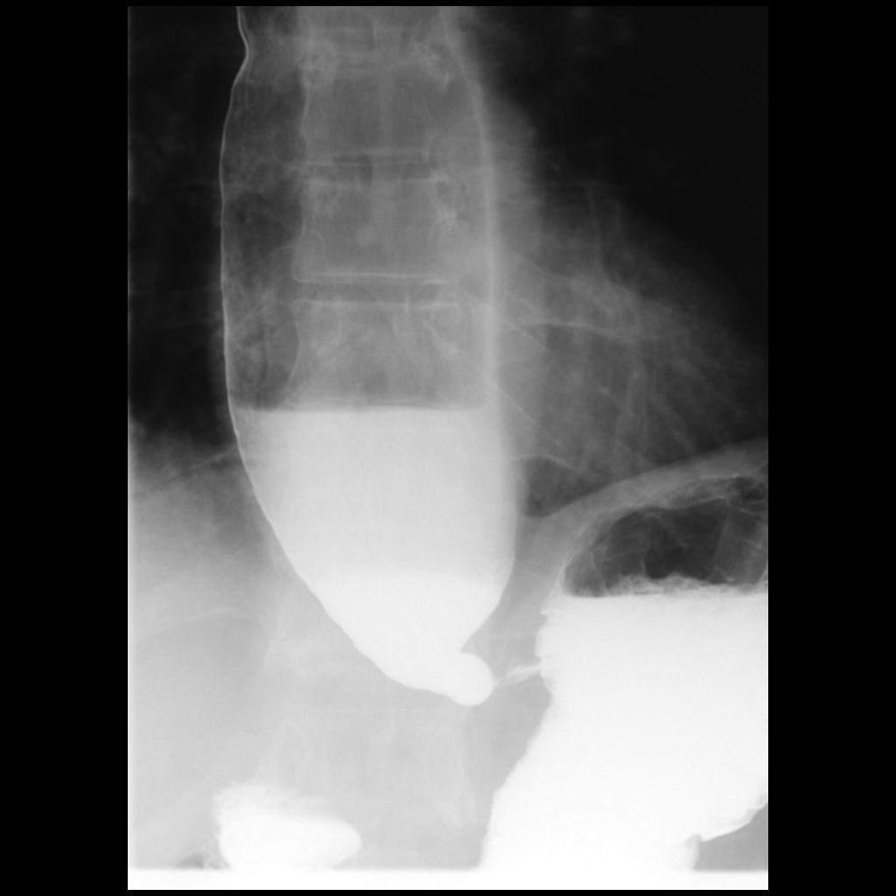

[Series 10: cp_standard · 0.18mm/px · 1 of 1 slices shown (6 of 6)]
[im 1/1]
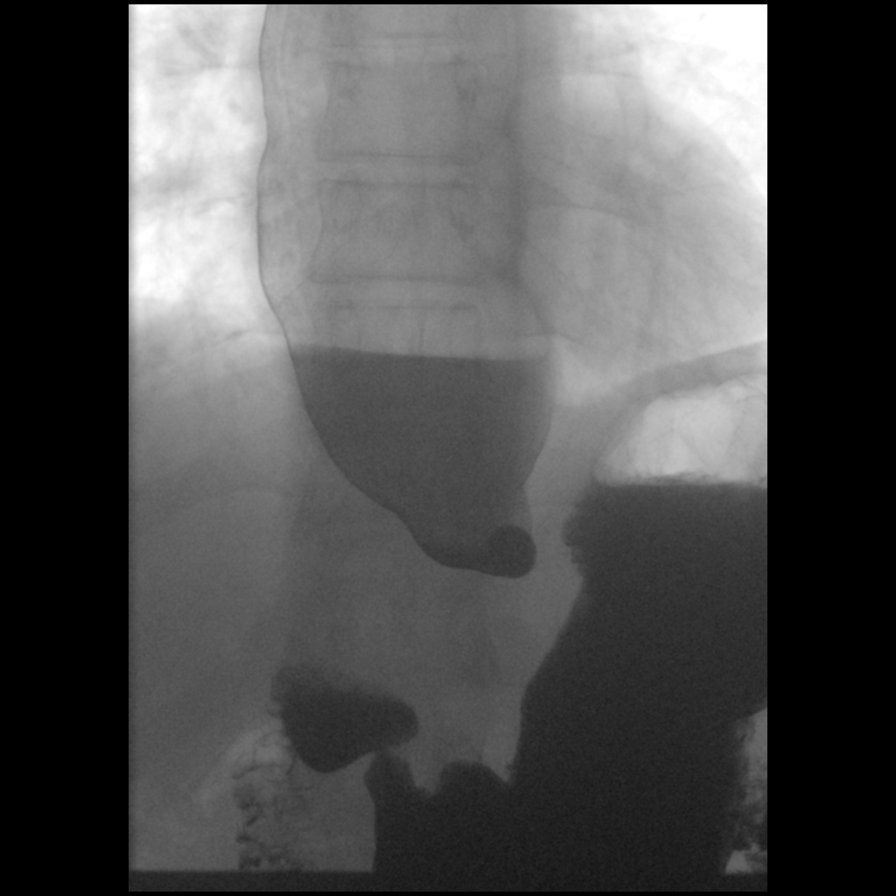

[12 of 24 positions shown; findings below may reference images not displayed]

FINDINGS: The esophagus is markedly dilated throughout the thoracic component.
There is smooth tapering of the esophagus at the lower esophageal
sphincter which intermittently allowed small squirts of barium to
pass into the stomach. The esophagus has poor motility. No ulcer or
mass in the esophagus. Pharyngeal phase of swallowing appeared
normal. Cervical esophagus mildly dilated.

No reflux identified. Barium tablet did not pass through the lower
esophageal sphincter.
IMPRESSION: Markedly dilated esophagus with poor motility. Smooth tapering of
the esophagus at the lower esophageal sphincter compatible with
chronic achalasia. Images and diagnosis reviewed with patient.

## 2023-06-24 ENCOUNTER — Ambulatory Visit: Payer: BC Managed Care – PPO | Admitting: Adult Health

## 2023-06-24 ENCOUNTER — Encounter: Payer: Self-pay | Admitting: Adult Health

## 2023-06-24 DIAGNOSIS — F909 Attention-deficit hyperactivity disorder, unspecified type: Secondary | ICD-10-CM | POA: Diagnosis not present

## 2023-06-24 MED ORDER — AMPHETAMINE-DEXTROAMPHETAMINE 20 MG PO TABS
20.0000 mg | ORAL_TABLET | Freq: Every day | ORAL | 0 refills | Status: DC
Start: 2023-06-24 — End: 2023-09-21

## 2023-06-24 MED ORDER — AMPHETAMINE-DEXTROAMPHET ER 30 MG PO CP24: 30.0000 mg | ORAL_CAPSULE | Freq: Every day | ORAL | 0 refills | Status: DC

## 2023-06-24 NOTE — Progress Notes (Signed)
Riley Cardenas [id number] 1980-10-14 42 y.o.  Subjective:   Patient ID:  Riley Cardenas is a 42 y.o. (DOB 09-28-1980) male.  Chief Complaint: No chief complaint on file.   HPI Riley Cardenas presents to the office today for follow-up of ADHD.  Describes mood today as "ok". Pleasant. Denies tearfulness. Mood symptoms - denies depression, anxiety, and irritability. Denies panic attacks. Denies worry, rumination, and over thinking. Mood is consistent. Stating " I'm doing alright". Feels like medications continue to work well. Stable interest and motivation. Taking medications as prescribed.  Energy levels stable. Active, does not have a regular exercise routine. Enjoys some usual interests and activities. Married. Lives with wife his sons. Spending time with family.  Appetite adequate. Weight gain. Sleeps is consistent. Averages 6 to 8 hours. Focus and concentration stable. Completing tasks. Managing aspects of household. 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  Denies AH or VH. Denies self harm. Denies substance use.     AUDIT    Flowsheet Row Office Visit from 03/23/2023 in [facility] [place]  Alcohol Use Disorder Identification Test Final Score (AUDIT) 3       PHQ2-9    Flowsheet Row Office Visit from 04/29/2022 in [facility] [place] Office Visit from 01/22/2021 in [facility] [place] Office Visit from 04/12/2019 in [facility] [place]  PHQ-2 Total Score 1 1 0      Flowsheet Row ED from 12/01/2022 in [facility] at [facility]  C-SSRS RISK CATEGORY No Risk        Review of Systems:  Review of Systems  Musculoskeletal:  Negative for gait problem.  Neurological:  Negative for tremors.  Psychiatric/Behavioral:         Please refer to HPI    Medications: I have reviewed the patient's current medications.  Current Outpatient Medications  Medication  Sig Dispense Refill   amphetamine-dextroamphetamine (ADDERALL XR) 30 MG 24 hr capsule Take 1 capsule (30 mg total) by mouth daily. 30 capsule 0   amphetamine-dextroamphetamine (ADDERALL XR) 30 MG 24 hr capsule Take 1 capsule (30 mg total) by mouth daily. 30 capsule 0   amphetamine-dextroamphetamine (ADDERALL XR) 30 MG 24 hr capsule Take 1 capsule (30 mg total) by mouth daily. 30 capsule 0   amphetamine-dextroamphetamine (ADDERALL) 20 MG tablet Take 1 tablet (20 mg total) by mouth daily. 30 tablet 0   cetirizine (ZYRTEC) 10 MG tablet Take 10 mg by mouth daily.     naproxen (NAPROSYN) 500 MG tablet Take 1 tablet (500 mg total) by mouth 2 (two) times daily with a meal. 30 tablet 1   pantoprazole (PROTONIX) 40 MG tablet Take 1 tablet (40 mg total) by mouth daily. 90 tablet 3   traMADol (ULTRAM) 50 MG tablet Take 1 tablet (50 mg total) by mouth at bedtime as needed. 15 tablet 0   No current facility-administered medications for this visit.    Medication Side Effects: None  Allergies: No Known Allergies  Past Medical History:  Diagnosis Date   ADD (attention deficit disorder)    GERD (gastroesophageal reflux disease)     Past Medical History, Surgical history, Social history, and Family history were reviewed and updated as appropriate.   Please see review of systems for further details on the patient's review from today.   Objective:   Physical Exam:  There were no vitals taken for this visit.  Physical Exam Constitutional:  General: He is not in acute distress. Musculoskeletal:        General: No deformity.  Neurological:     Mental Status: He is alert and oriented to person, place, and time.     Coordination: Coordination normal.  Psychiatric:        Attention and Perception: Attention and perception normal. He does not perceive auditory or visual hallucinations.        Mood and Affect: Affect is not labile, blunt, angry or inappropriate.        Speech: Speech normal.         Behavior: Behavior normal.        Thought Content: Thought content normal. Thought content is not paranoid or delusional. Thought content does not include homicidal or suicidal ideation. Thought content does not include homicidal or suicidal plan.        Cognition and Memory: Cognition and memory normal.        Judgment: Judgment normal.     Comments: Insight intact     Lab Review:     Component Value Date/Time   NA 140 04/29/2022 0829   K 4.1 04/29/2022 0829   CL 105 04/29/2022 0829   CO2 25 04/29/2022 0829   GLUCOSE 86 04/29/2022 0829   BUN 12 04/29/2022 0829   CREATININE 0.96 04/29/2022 0829   CALCIUM 9.2 04/29/2022 0829   PROT 7.0 04/29/2022 0829   ALBUMIN 4.1 04/29/2022 0829   AST 18 04/29/2022 0829   ALT 11 04/29/2022 0829   ALKPHOS 56 04/29/2022 0829   BILITOT 0.7 04/29/2022 0829       Component Value Date/Time   WBC 4.4 04/29/2022 0829   RBC 4.86 04/29/2022 0829   HGB 13.9 04/29/2022 0829   HCT 41.4 04/29/2022 0829   PLT 198.0 04/29/2022 0829   MCV 85.2 04/29/2022 0829   MCHC 33.5 04/29/2022 0829   RDW 14.7 04/29/2022 0829   LYMPHSABS 1.7 04/29/2022 0829   MONOABS 0.3 04/29/2022 0829   EOSABS 0.3 04/29/2022 0829   BASOSABS 0.1 04/29/2022 0829    No results found for: "POCLITH", "LITHIUM"   No results found for: "PHENYTOIN", "PHENOBARB", "VALPROATE", "CBMZ"   .res Assessment: Plan:    Plan:  1. Adderall XR 30mg  once daily 2. Adderall 20mg  daily - 1 script sent  Monitor BP between visits while taking stimulant medication.   RTC 3 months  Time spent with patient was 10 minutes. Greater than 50% of face to face time with patient was spent on counseling and coordination of care.    Patient advised to contact office with any questions, adverse effects, or acute worsening in signs and symptoms.  Discussed potential benefits, risks, and side effects of stimulants with patient to include increased heart rate, palpitations, insomnia, increased anxiety,  increased irritability, or decreased appetite.  Instructed patient to contact office if experiencing any significant tolerability issues. Diagnoses and all orders for this visit:  Attention deficit hyperactivity disorder (ADHD), unspecified ADHD type     Please see After Visit Summary for patient specific instructions.  Future Appointments  Date Time Provider Department Center  06/24/2023  8:00 AM Stephaun Million, Thereasa Solo, NP [facility] None    No orders of the defined types were placed in this encounter.   -------------------------------

## 2023-09-21 ENCOUNTER — Encounter: Payer: Self-pay | Admitting: Adult Health

## 2023-09-21 ENCOUNTER — Telehealth: Payer: 59 | Admitting: Adult Health

## 2023-09-21 DIAGNOSIS — F909 Attention-deficit hyperactivity disorder, unspecified type: Secondary | ICD-10-CM | POA: Diagnosis not present

## 2023-09-21 MED ORDER — AMPHETAMINE-DEXTROAMPHET ER 30 MG PO CP24
30.0000 mg | ORAL_CAPSULE | Freq: Every day | ORAL | 0 refills | Status: DC
Start: 2023-11-16 — End: 2024-01-17

## 2023-09-21 MED ORDER — AMPHETAMINE-DEXTROAMPHET ER 30 MG PO CP24: 30.0000 mg | ORAL_CAPSULE | Freq: Every day | ORAL | 0 refills | Status: DC

## 2023-09-21 MED ORDER — AMPHETAMINE-DEXTROAMPHETAMINE 20 MG PO TABS: 20.0000 mg | ORAL_TABLET | Freq: Every day | ORAL | 0 refills | Status: DC

## 2023-09-21 MED ORDER — AMPHETAMINE-DEXTROAMPHET ER 30 MG PO CP24
30.0000 mg | ORAL_CAPSULE | Freq: Every day | ORAL | 0 refills | Status: DC
Start: 2023-09-21 — End: 2024-01-11

## 2023-09-21 NOTE — Progress Notes (Signed)
 Riley Cardenas [id number] 09-23-1980 43 y.o.  Virtual Visit via Video Note  I connected with pt @ on 09/21/23 at  8:00 AM EST by a video enabled telemedicine application and verified that I am speaking with the correct person using two identifiers.   I discussed the limitations of evaluation and management by telemedicine and the availability of in person appointments. The patient expressed understanding and agreed to proceed.  I discussed the assessment and treatment plan with the patient. The patient was provided an opportunity to ask questions and all were answered. The patient agreed with the plan and demonstrated an understanding of the instructions.   The patient was advised to call back or seek an in-person evaluation if the symptoms worsen or if the condition fails to improve as anticipated.  I provided 10 minutes of non-face-to-face time during this encounter.  The patient was located at home.  The provider was located at [facility].   Riley LOISE Sayers, NP   Subjective:   Patient ID:  Riley Cardenas is a 43 y.o. (DOB Dec 20, 1980) male.  Chief Complaint: No chief complaint on file.   HPI Riley Cardenas presents for follow-up of ADHD.  Describes mood today as ok. Pleasant. Denies tearfulness. Mood symptoms - denies depression, anxiety, and irritability. Denies panic attacks. Denies worry, rumination, and over thinking. Reporting some situational stressors involving care of Aunt. Mood is consistent. Stating I feel like I'm doing alright. Feels like medications continue to work well. Stable interest and motivation. Taking medications as prescribed.  Energy levels stable. Active, does not have a regular exercise routine. Enjoys some usual interests and activities. Married. Lives with wife and his sons. Spending time with family.  Appetite adequate. Weight gain. Sleeps is consistent. Averages 6 to 8 hours. Focus and concentration stable. Completing tasks. Managing aspects of  household. Works full-time as a runner, broadcasting/film/video. Denies SI or HI.  Denies AH or VH. Denies self harm. Denies substance use.   Review of Systems:  Review of Systems  Musculoskeletal:  Negative for gait problem.  Neurological:  Negative for tremors.  Psychiatric/Behavioral:         Please refer to HPI    Medications: I have reviewed the patient's current medications.  Current Outpatient Medications  Medication Sig Dispense Refill   amphetamine -dextroamphetamine  (ADDERALL XR) 30 MG 24 hr capsule Take 1 capsule (30 mg total) by mouth daily. 30 capsule 0   amphetamine -dextroamphetamine  (ADDERALL XR) 30 MG 24 hr capsule Take 1 capsule (30 mg total) by mouth daily. 30 capsule 0   amphetamine -dextroamphetamine  (ADDERALL XR) 30 MG 24 hr capsule Take 1 capsule (30 mg total) by mouth daily. 30 capsule 0   amphetamine -dextroamphetamine  (ADDERALL) 20 MG tablet Take 1 tablet (20 mg total) by mouth daily. 30 tablet 0   cetirizine (ZYRTEC) 10 MG tablet Take 10 mg by mouth daily.     naproxen  (NAPROSYN ) 500 MG tablet Take 1 tablet (500 mg total) by mouth 2 (two) times daily with a meal. 30 tablet 1   pantoprazole  (PROTONIX ) 40 MG tablet Take 1 tablet (40 mg total) by mouth daily. 90 tablet 3   traMADol  (ULTRAM ) 50 MG tablet Take 1 tablet (50 mg total) by mouth at bedtime as needed. 15 tablet 0   No current facility-administered medications for this visit.    Medication Side Effects: None  Allergies: No Known Allergies  Past Medical History:  Diagnosis Date   ADD (attention deficit disorder)    GERD (gastroesophageal reflux disease)  Family History  Problem Relation Age of Onset   Arthritis Maternal Grandmother    Hypertension Maternal Grandfather    Arthritis Maternal Grandfather    Colon cancer Neg Hx    Esophageal cancer Neg Hx    Rectal cancer Neg Hx    Stomach cancer Neg Hx     Social History   Socioeconomic History   Marital status: Married    Spouse name: Not on file   Number  of children: Not on file   Years of education: Not on file   Highest education level: Bachelor's degree (e.g., BA, AB, BS)  Occupational History   Not on file  Tobacco Use   Smoking status: Former   Smokeless tobacco: Never  Vaping Use   Vaping status: Never Used  Substance and Sexual Activity   Alcohol use: Yes    Alcohol/week: 0.0 standard drinks of alcohol    Comment: occ   Drug use: No   Sexual activity: Yes    Partners: Female  Other Topics Concern   Not on file  Social History Narrative   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    54 month old son    Social Drivers of Corporate Investment Banker Strain: Low Risk  (03/23/2023)   Overall Financial Resource Strain (CARDIA)    Difficulty of Paying Living Expenses: Not very hard  Food Insecurity: No Food Insecurity (03/23/2023)   Hunger Vital Sign    Worried About Running Out of Food in the Last Year: Never true    Ran Out of Food in the Last Year: Never true  Transportation Needs: No Transportation Needs (03/23/2023)   PRAPARE - Administrator, Civil Service (Medical): No    Lack of Transportation (Non-Medical): No  Physical Activity: Unknown (03/23/2023)   Exercise Vital Sign    Days of Exercise per Week: 0 days    Minutes of Exercise per Session: Not on file  Stress: No Stress Concern Present (03/23/2023)   [employer] of Occupational Health - Occupational Stress Questionnaire    Feeling of Stress : Only a little  Social Connections: Moderately Integrated (03/23/2023)   Social Connection and Isolation Panel [NHANES]    Frequency of Communication with Friends and Family: Once a week    Frequency of Social Gatherings with Friends and Family: Once a week    Attends Religious Services: More than 4 times per year    Active Member of [employer] or Organizations: Yes    Attends Engineer, Structural: More than 4 times per year    Marital Status: Married  Catering Manager Violence: Not on  file    Past Medical History, Surgical history, Social history, and Family history were reviewed and updated as appropriate.   Please see review of systems for further details on the patient's review from today.   Objective:   Physical Exam:  There were no vitals taken for this visit.  Physical Exam Constitutional:      General: He is not in acute distress. Musculoskeletal:        General: No deformity.  Neurological:     Mental Status: He is alert and oriented to person, place, and time.     Coordination: Coordination normal.  Psychiatric:        Attention and Perception: Attention and perception normal. He does not perceive auditory or visual hallucinations.        Mood and Affect: Affect is not  labile, blunt, angry or inappropriate.        Speech: Speech normal.        Behavior: Behavior normal.        Thought Content: Thought content normal. Thought content is not paranoid or delusional. Thought content does not include homicidal or suicidal ideation. Thought content does not include homicidal or suicidal plan.        Cognition and Memory: Cognition and memory normal.        Judgment: Judgment normal.     Comments: Insight intact     Lab Review:     Component Value Date/Time   NA 140 04/29/2022 0829   K 4.1 04/29/2022 0829   CL 105 04/29/2022 0829   CO2 25 04/29/2022 0829   GLUCOSE 86 04/29/2022 0829   BUN 12 04/29/2022 0829   CREATININE 0.96 04/29/2022 0829   CALCIUM 9.2 04/29/2022 0829   PROT 7.0 04/29/2022 0829   ALBUMIN 4.1 04/29/2022 0829   AST 18 04/29/2022 0829   ALT 11 04/29/2022 0829   ALKPHOS 56 04/29/2022 0829   BILITOT 0.7 04/29/2022 0829       Component Value Date/Time   WBC 4.4 04/29/2022 0829   RBC 4.86 04/29/2022 0829   HGB 13.9 04/29/2022 0829   HCT 41.4 04/29/2022 0829   PLT 198.0 04/29/2022 0829   MCV 85.2 04/29/2022 0829   MCHC 33.5 04/29/2022 0829   RDW 14.7 04/29/2022 0829   LYMPHSABS 1.7 04/29/2022 0829   MONOABS 0.3 04/29/2022  0829   EOSABS 0.3 04/29/2022 0829   BASOSABS 0.1 04/29/2022 0829    No results found for: POCLITH, LITHIUM   No results found for: PHENYTOIN, PHENOBARB, VALPROATE, CBMZ   .res Assessment: Plan:    Plan:  1. Adderall XR 30mg  once daily 2. Adderall 20mg  daily - 1 script sent  Monitor BP between visits while taking stimulant medication.   RTC 3 months  10 minutes spent dedicated to the care of this patient on the date of this encounter to include pre-visit review of records, ordering of medication, post visit documentation, and face-to-face time with the patient discussing ADD. Discussed continuing current medication regimen.  Patient advised to contact office with any questions, adverse effects, or acute worsening in signs and symptoms.  Discussed potential benefits, risks, and side effects of stimulants with patient to include increased heart rate, palpitations, insomnia, increased anxiety, increased irritability, or decreased appetite.  Instructed patient to contact office if experiencing any significant tolerability issues.  There are no diagnoses linked to this encounter.   Please see After Visit Summary for patient specific instructions.  Future Appointments  Date Time Provider Department Center  09/21/2023  8:00 AM Brison Fiumara Nattalie, NP CP-[facility] None    No orders of the defined types were placed in this encounter.     -------------------------------

## 2024-01-11 ENCOUNTER — Telehealth: Payer: Self-pay | Admitting: Adult Health

## 2024-01-11 ENCOUNTER — Other Ambulatory Visit: Payer: Self-pay

## 2024-01-11 DIAGNOSIS — F909 Attention-deficit hyperactivity disorder, unspecified type: Secondary | ICD-10-CM

## 2024-01-11 MED ORDER — AMPHETAMINE-DEXTROAMPHET ER 30 MG PO CP24
30.0000 mg | ORAL_CAPSULE | Freq: Every day | ORAL | 0 refills | Status: DC
Start: 2024-01-11 — End: 2024-01-17

## 2024-01-11 MED ORDER — AMPHETAMINE-DEXTROAMPHETAMINE 20 MG PO TABS
20.0000 mg | ORAL_TABLET | Freq: Every day | ORAL | 0 refills | Status: DC
Start: 2024-01-11 — End: 2024-01-17

## 2024-01-11 NOTE — Telephone Encounter (Signed)
 Pt's wife called in. Made tandem appt for her and her husband 6/2.  He will need Rfs on Adderall XR 30, and Adderall 20mg  . Please send in to: [id number] - [place], [place] - [address]  [address], [place] [address]

## 2024-01-11 NOTE — Telephone Encounter (Signed)
 Pended Adderall XR 30 and IR 20 to [facility] [address].

## 2024-01-17 ENCOUNTER — Telehealth (INDEPENDENT_AMBULATORY_CARE_PROVIDER_SITE_OTHER): Admitting: Adult Health

## 2024-01-17 ENCOUNTER — Encounter: Payer: Self-pay | Admitting: Adult Health

## 2024-01-17 DIAGNOSIS — F909 Attention-deficit hyperactivity disorder, unspecified type: Secondary | ICD-10-CM | POA: Diagnosis not present

## 2024-01-17 MED ORDER — AMPHETAMINE-DEXTROAMPHET ER 30 MG PO CP24: 30.0000 mg | ORAL_CAPSULE | Freq: Every day | ORAL | 0 refills | Status: DC

## 2024-01-17 MED ORDER — AMPHETAMINE-DEXTROAMPHETAMINE 20 MG PO TABS: 20.0000 mg | ORAL_TABLET | Freq: Every day | ORAL | 0 refills | Status: DC

## 2024-01-17 MED ORDER — AMPHETAMINE-DEXTROAMPHET ER 30 MG PO CP24
30.0000 mg | ORAL_CAPSULE | Freq: Every day | ORAL | 0 refills | Status: DC
Start: 2024-02-14 — End: 2024-04-19

## 2024-01-17 NOTE — Progress Notes (Signed)
 Riley Cardenas [id number] 12/13/1980 43 y.o.  Virtual Visit via Video Note  I connected with pt @ on 01/17/24 at  8:00 AM EDT by a video enabled telemedicine application and verified that I am speaking with the correct person using two identifiers.   I discussed the limitations of evaluation and management by telemedicine and the availability of in person appointments. The patient expressed understanding and agreed to proceed.  I discussed the assessment and treatment plan with the patient. The patient was provided an opportunity to ask questions and all were answered. The patient agreed with the plan and demonstrated an understanding of the instructions.   The patient was advised to call back or seek an in-person evaluation if the symptoms worsen or if the condition fails to improve as anticipated.  I provided 10 minutes of non-face-to-face time during this encounter.  The patient was located at home.  The provider was located at [facility].   Reagan Camera, NP   Subjective:   Patient ID:  Riley Cardenas is a 43 y.o. (DOB 09/18/1980) male.  Chief Complaint: No chief complaint on file.   HPI Riley Cardenas presents for follow-up of ADHD.  Describes mood today as "ok". Pleasant. Denies tearfulness. Mood symptoms - denies depression, anxiety and irritability. Reports stable interest and motivation. Denies panic attacks. Denies worry, rumination and over thinking. 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 Mood is consistent. Stating "I feel like I'm doing ok". Feels like medications continue to work well. Taking medications as prescribed.  Energy levels stable. Active, does not have a regular exercise routine. Enjoys some usual interests and activities. Married. Lives with wife and his sons. Spending time with family.  Appetite adequate. Weight stable. Sleeps is consistent. Averages 6 to 8 hours. Focus and  concentration stable. Completing tasks. Managing aspects of household. 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  Denies AH or VH. Denies self harm. Denies substance use.     Review of Systems:  Review of Systems  Musculoskeletal:  Negative for gait problem.  Neurological:  Negative for tremors.  Psychiatric/Behavioral:         Please refer to HPI    Medications: I have reviewed the patient's current medications.  Current Outpatient Medications  Medication Sig Dispense Refill   amphetamine -dextroamphetamine  (ADDERALL XR) 30 MG 24 hr capsule Take 1 capsule (30 mg total) by mouth daily. 30 capsule 0   amphetamine -dextroamphetamine  (ADDERALL XR) 30 MG 24 hr capsule Take 1 capsule (30 mg total) by mouth daily. 30 capsule 0   amphetamine -dextroamphetamine  (ADDERALL XR) 30 MG 24 hr capsule Take 1 capsule (30 mg total) by mouth daily. 30 capsule 0   amphetamine -dextroamphetamine  (ADDERALL) 20 MG tablet Take 1 tablet (20 mg total) by mouth daily. 30 tablet 0   cetirizine (ZYRTEC) 10 MG tablet Take 10 mg by mouth daily.     naproxen  (NAPROSYN ) 500 MG tablet Take 1 tablet (500 mg total) by mouth 2 (two) times daily with a meal. 30 tablet 1   pantoprazole  (PROTONIX ) 40 MG tablet Take 1 tablet (40 mg total) by mouth daily. 90 tablet 3   traMADol  (ULTRAM ) 50 MG tablet Take 1 tablet (50 mg total) by mouth at bedtime as needed. 15 tablet 0   No current facility-administered medications for this visit.    Medication Side Effects: None  Allergies: No Known Allergies  Past Medical History:  Diagnosis Date   ADD (  attention deficit disorder)    GERD (gastroesophageal reflux disease)     Family History  Problem Relation Age of Onset   Arthritis Maternal Grandmother    Hypertension Maternal Grandfather    Arthritis Maternal Grandfather    Colon cancer Neg Hx    Esophageal cancer Neg Hx    Rectal cancer Neg Hx    Stomach cancer Neg Hx     Social History   Socioeconomic History    Marital status: Married    Spouse name: Not on file   Number of children: Not on file   Years of education: Not on file   Highest education level: Bachelor's degree (e.g., BA, AB, BS)  Occupational History   Not on file  Tobacco Use   Smoking status: Former   Smokeless tobacco: Never  Vaping Use   Vaping status: Never Used  Substance and Sexual Activity   Alcohol use: Yes    Alcohol/week: 0.0 standard drinks of alcohol    Comment: occ   Drug use: No   Sexual activity: Yes    Partners: Female  Other Topics Concern   Not on file  Social History Narrative   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    57 month old son    Social Drivers of Corporate investment banker Strain: Low Risk  (03/23/2023)   Overall Financial Resource Strain (CARDIA)    Difficulty of Paying Living Expenses: Not very hard  Food Insecurity: No Food Insecurity (03/23/2023)   Hunger Vital Sign    Worried About Running Out of Food in the Last Year: Never true    Ran Out of Food in the Last Year: Never true  Transportation Needs: No Transportation Needs (03/23/2023)   PRAPARE - Administrator, Civil Service (Medical): No    Lack of Transportation (Non-Medical): No  Physical Activity: Unknown (03/23/2023)   Exercise Vital Sign    Days of Exercise per Week: 0 days    Minutes of Exercise per Session: Not on file  Stress: No Stress Concern Present (03/23/2023)   [employer] of Occupational Health - Occupational Stress Questionnaire    Feeling of Stress : Only a little  Social Connections: Moderately Integrated (03/23/2023)   Social Connection and Isolation Panel [NHANES]    Frequency of Communication with Friends and Family: Once a week    Frequency of Social Gatherings with Friends and Family: Once a week    Attends Religious Services: More than 4 times per year    Active Member of [employer] or Organizations: Yes    Attends Engineer, structural: More than 4 times per year     Marital Status: Married  Catering manager Violence: Not on file    Past Medical History, Surgical history, Social history, and Family history were reviewed and updated as appropriate.   Please see review of systems for further details on the patient's review from today.   Objective:   Physical Exam:  There were no vitals taken for this visit.  Physical Exam Constitutional:      General: He is not in acute distress. Musculoskeletal:        General: No deformity.  Neurological:     Mental Status: He is alert and oriented to person, place, and time.     Coordination: Coordination normal.  Psychiatric:        Attention and Perception: Attention and perception normal. He does not perceive auditory or visual  hallucinations.        Mood and Affect: Mood normal. Mood is not anxious or depressed. Affect is not labile, blunt, angry or inappropriate.        Speech: Speech normal.        Behavior: Behavior normal.        Thought Content: Thought content normal. Thought content is not paranoid or delusional. Thought content does not include homicidal or suicidal ideation. Thought content does not include homicidal or suicidal plan.        Cognition and Memory: Cognition and memory normal.        Judgment: Judgment normal.     Comments: Insight intact     Lab Review:     Component Value Date/Time   NA 140 04/29/2022 0829   K 4.1 04/29/2022 0829   CL 105 04/29/2022 0829   CO2 25 04/29/2022 0829   GLUCOSE 86 04/29/2022 0829   BUN 12 04/29/2022 0829   CREATININE 0.96 04/29/2022 0829   CALCIUM 9.2 04/29/2022 0829   PROT 7.0 04/29/2022 0829   ALBUMIN 4.1 04/29/2022 0829   AST 18 04/29/2022 0829   ALT 11 04/29/2022 0829   ALKPHOS 56 04/29/2022 0829   BILITOT 0.7 04/29/2022 0829       Component Value Date/Time   WBC 4.4 04/29/2022 0829   RBC 4.86 04/29/2022 0829   HGB 13.9 04/29/2022 0829   HCT 41.4 04/29/2022 0829   PLT 198.0 04/29/2022 0829   MCV 85.2 04/29/2022 0829    MCHC 33.5 04/29/2022 0829   RDW 14.7 04/29/2022 0829   LYMPHSABS 1.7 04/29/2022 0829   MONOABS 0.3 04/29/2022 0829   EOSABS 0.3 04/29/2022 0829   BASOSABS 0.1 04/29/2022 0829    No results found for: "POCLITH", "LITHIUM"   No results found for: "PHENYTOIN", "PHENOBARB", "VALPROATE", "CBMZ"   .res Assessment: Plan:   Plan:  1. Adderall XR 30mg  once daily 2. Adderall 20mg  daily - 1 script sent  Monitor BP between visits while taking stimulant medication.   RTC 3 months  10 minutes spent dedicated to the care of this patient on the date of this encounter to include pre-visit review of records, ordering of medication, post visit documentation, and face-to-face time with the patient discussing ADD. Discussed continuing current medication regimen.  Patient advised to contact office with any questions, adverse effects, or acute worsening in signs and symptoms.  Discussed potential benefits, risks, and side effects of stimulants with patient to include increased heart rate, palpitations, insomnia, increased anxiety, increased irritability, or decreased appetite.  Instructed patient to contact office if experiencing any significant tolerability issues.  There are no diagnoses linked to this encounter.   Please see After Visit Summary for patient specific instructions.  Future Appointments  Date Time Provider Department Center  01/17/2024  8:00 AM Yania Bogie Nattalie, NP [facility] None    No orders of the defined types were placed in this encounter.     -------------------------------

## 2024-04-19 ENCOUNTER — Encounter: Payer: Self-pay | Admitting: Adult Health

## 2024-04-19 ENCOUNTER — Telehealth: Admitting: Adult Health

## 2024-04-19 DIAGNOSIS — F909 Attention-deficit hyperactivity disorder, unspecified type: Secondary | ICD-10-CM

## 2024-04-19 MED ORDER — AMPHETAMINE-DEXTROAMPHET ER 30 MG PO CP24: 30.0000 mg | ORAL_CAPSULE | Freq: Every day | ORAL | 0 refills | Status: DC

## 2024-04-19 MED ORDER — AMPHETAMINE-DEXTROAMPHETAMINE 20 MG PO TABS: 20.0000 mg | ORAL_TABLET | Freq: Every day | ORAL | 0 refills | Status: DC

## 2024-04-19 NOTE — Progress Notes (Signed)
 Riley Cardenas [id number] March 14, 1981 43 y.o.  Virtual Visit via Video Note  I connected with pt @ on 04/19/24 at  8:00 AM EDT by a video enabled telemedicine application and verified that I am speaking with the correct person using two identifiers.   I discussed the limitations of evaluation and management by telemedicine and the availability of in person appointments. The patient expressed understanding and agreed to proceed.  I discussed the assessment and treatment plan with the patient. The patient was provided an opportunity to ask questions and all were answered. The patient agreed with the plan and demonstrated an understanding of the instructions.   The patient was advised to call back or seek an in-person evaluation if the symptoms worsen or if the condition fails to improve as anticipated.  I provided 10 minutes of non-face-to-face time during this encounter.  The patient was located at home.  The provider was located at [facility].   Angeline LOISE Sayers, NP   Subjective:   Patient ID:  Riley Cardenas is a 43 y.o. (DOB 1981-02-02) male.  Chief Complaint: No chief complaint on file.   HPI Riley Cardenas presents for follow-up of ADHD.  Describes mood today as ok. Pleasant. Denies tearfulness. Mood symptoms - denies depression, anxiety and irritability. Reports stable interest and motivation. Denies panic attacks. Denies worry, rumination and over thinking. Reporting some situational stressors - Aunt. Reports mood is stable. Stating I feel like I'm doing ok. Feels like medications continue to work well. Taking medications as prescribed.  Energy levels stable. Active, does not have a regular exercise routine. Enjoys some usual interests and activities. Married. Lives with wife and his sons. Spending time with family.  Appetite adequate. Weight stable. Sleeps is variable. Averages 6 hours. Focus and concentration stable. Completing tasks. Managing aspects of household. Works  full-time as a Runner, broadcasting/film/video. Denies SI or HI.  Denies AH or VH. Denies self harm. Denies substance use.  Review of Systems:  Review of Systems  Musculoskeletal:  Negative for gait problem.  Neurological:  Negative for tremors.  Psychiatric/Behavioral:         Please refer to HPI    Medications: I have reviewed the patient's current medications.  Current Outpatient Medications  Medication Sig Dispense Refill   amphetamine -dextroamphetamine  (ADDERALL XR) 30 MG 24 hr capsule Take 1 capsule (30 mg total) by mouth daily. 30 capsule 0   amphetamine -dextroamphetamine  (ADDERALL XR) 30 MG 24 hr capsule Take 1 capsule (30 mg total) by mouth daily. 30 capsule 0   amphetamine -dextroamphetamine  (ADDERALL XR) 30 MG 24 hr capsule Take 1 capsule (30 mg total) by mouth daily. 30 capsule 0   amphetamine -dextroamphetamine  (ADDERALL) 20 MG tablet Take 1 tablet (20 mg total) by mouth daily. 30 tablet 0   cetirizine (ZYRTEC) 10 MG tablet Take 10 mg by mouth daily.     naproxen  (NAPROSYN ) 500 MG tablet Take 1 tablet (500 mg total) by mouth 2 (two) times daily with a meal. 30 tablet 1   pantoprazole  (PROTONIX ) 40 MG tablet Take 1 tablet (40 mg total) by mouth daily. 90 tablet 3   traMADol  (ULTRAM ) 50 MG tablet Take 1 tablet (50 mg total) by mouth at bedtime as needed. 15 tablet 0   No current facility-administered medications for this visit.    Medication Side Effects: None  Allergies: No Known Allergies  Past Medical History:  Diagnosis Date   ADD (attention deficit disorder)    GERD (gastroesophageal reflux disease)     Family History  Problem Relation Age of Onset   Arthritis Maternal Grandmother    Hypertension Maternal Grandfather    Arthritis Maternal Grandfather    Colon cancer Neg Hx    Esophageal cancer Neg Hx    Rectal cancer Neg Hx    Stomach cancer Neg Hx     Social History   Socioeconomic History   Marital status: Married    Spouse name: Not on file   Number of children: Not  on file   Years of education: Not on file   Highest education level: Bachelor's degree (e.g., BA, AB, BS)  Occupational History   Not on file  Tobacco Use   Smoking status: Former   Smokeless tobacco: Never  Vaping Use   Vaping status: Never Used  Substance and Sexual Activity   Alcohol use: Yes    Alcohol/week: 0.0 standard drinks of alcohol    Comment: occ   Drug use: No   Sexual activity: Yes    Partners: Female  Other Topics Concern   Not on file  Social History Narrative   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    57 month old son    Social Drivers of Corporate investment banker Strain: Low Risk  (03/23/2023)   Overall Financial Resource Strain (CARDIA)    Difficulty of Paying Living Expenses: Not very hard  Food Insecurity: No Food Insecurity (03/23/2023)   Hunger Vital Sign    Worried About Running Out of Food in the Last Year: Never true    Ran Out of Food in the Last Year: Never true  Transportation Needs: No Transportation Needs (03/23/2023)   PRAPARE - Administrator, Civil Service (Medical): No    Lack of Transportation (Non-Medical): No  Physical Activity: Unknown (03/23/2023)   Exercise Vital Sign    Days of Exercise per Week: 0 days    Minutes of Exercise per Session: Not on file  Stress: No Stress Concern Present (03/23/2023)   [employer] of Occupational Health - Occupational Stress Questionnaire    Feeling of Stress : Only a little  Social Connections: Moderately Integrated (03/23/2023)   Social Connection and Isolation Panel    Frequency of Communication with Friends and Family: Once a week    Frequency of Social Gatherings with Friends and Family: Once a week    Attends Religious Services: More than 4 times per year    Active Member of [employer] or Organizations: Yes    Attends Engineer, structural: More than 4 times per year    Marital Status: Married  Catering manager Violence: Not on file    Past Medical  History, Surgical history, Social history, and Family history were reviewed and updated as appropriate.   Please see review of systems for further details on the patient's review from today.   Objective:   Physical Exam:  There were no vitals taken for this visit.  Physical Exam Constitutional:      General: He is not in acute distress. Musculoskeletal:        General: No deformity.  Neurological:     Mental Status: He is alert and oriented to person, place, and time.     Coordination: Coordination normal.  Psychiatric:        Attention and Perception: Attention and perception normal. He does not perceive auditory or visual hallucinations.        Mood and Affect: Mood normal. Mood is not anxious or  depressed. Affect is not labile, blunt, angry or inappropriate.        Speech: Speech normal.        Behavior: Behavior normal.        Thought Content: Thought content normal. Thought content is not paranoid or delusional. Thought content does not include homicidal or suicidal ideation. Thought content does not include homicidal or suicidal plan.        Cognition and Memory: Cognition and memory normal.        Judgment: Judgment normal.     Comments: Insight intact     Lab Review:     Component Value Date/Time   NA 140 04/29/2022 0829   K 4.1 04/29/2022 0829   CL 105 04/29/2022 0829   CO2 25 04/29/2022 0829   GLUCOSE 86 04/29/2022 0829   BUN 12 04/29/2022 0829   CREATININE 0.96 04/29/2022 0829   CALCIUM 9.2 04/29/2022 0829   PROT 7.0 04/29/2022 0829   ALBUMIN 4.1 04/29/2022 0829   AST 18 04/29/2022 0829   ALT 11 04/29/2022 0829   ALKPHOS 56 04/29/2022 0829   BILITOT 0.7 04/29/2022 0829       Component Value Date/Time   WBC 4.4 04/29/2022 0829   RBC 4.86 04/29/2022 0829   HGB 13.9 04/29/2022 0829   HCT 41.4 04/29/2022 0829   PLT 198.0 04/29/2022 0829   MCV 85.2 04/29/2022 0829   MCHC 33.5 04/29/2022 0829   RDW 14.7 04/29/2022 0829   LYMPHSABS 1.7 04/29/2022 0829    MONOABS 0.3 04/29/2022 0829   EOSABS 0.3 04/29/2022 0829   BASOSABS 0.1 04/29/2022 0829    No results found for: POCLITH, LITHIUM   No results found for: PHENYTOIN, PHENOBARB, VALPROATE, CBMZ   .res Assessment: Plan:    Plan:  1. Adderall XR 30mg  once daily 2. Adderall 20mg  daily - 1 script sent  Monitor BP between visits while taking stimulant medication.   RTC 3 months  10 minutes spent dedicated to the care of this patient on the date of this encounter to include pre-visit review of records, ordering of medication, post visit documentation, and face-to-face time with the patient discussing ADD. Discussed continuing current medication regimen.  Patient advised to contact office with any questions, adverse effects, or acute worsening in signs and symptoms.  Discussed potential benefits, risks, and side effects of stimulants with patient to include increased heart rate, palpitations, insomnia, increased anxiety, increased irritability, or decreased appetite.  Instructed patient to contact office if experiencing any significant tolerability issues.  There are no diagnoses linked to this encounter.   Please see After Visit Summary for patient specific instructions.  Future Appointments  Date Time Provider Department Center  04/19/2024  8:00 AM Dayon Witt Nattalie, NP CP-[facility] None    No orders of the defined types were placed in this encounter.     -------------------------------

## 2024-05-27 ENCOUNTER — Ambulatory Visit
Admission: EM | Admit: 2024-05-27 | Discharge: 2024-05-27 | Disposition: A | Discharge status: Home / Self Care | Attending: Internal Medicine | Admitting: Internal Medicine

## 2024-05-27 DIAGNOSIS — R0981 Nasal congestion: Secondary | ICD-10-CM

## 2024-05-27 DIAGNOSIS — H6122 Impacted cerumen, left ear: Secondary | ICD-10-CM

## 2024-05-27 DIAGNOSIS — J029 Acute pharyngitis, unspecified: Secondary | ICD-10-CM

## 2024-05-27 LAB — POCT RAPID STREP A (OFFICE): Rapid Strep A Screen: NEGATIVE

## 2024-05-27 NOTE — Discharge Instructions (Addendum)
 Strep testing done today and was negative.  There is postnasal drip present and likely this is contributing to the sore throat.  For now monitor the symptoms and can use over-the-counter nasal decongestion.  On physical exam we did note that the left ear was completely impacted with cerumen (earwax).  Left ear irrigation done today. Return to urgent care or PCP if symptoms worsen or fail to resolve.

## 2024-05-27 NOTE — ED Provider Notes (Signed)
 [facility]    CSN: [id number] Arrival date & time: 05/27/24  0908      History   Chief Complaint Chief Complaint  Patient presents with   Nasal Congestion   Sore Throat    HPI Riley Cardenas is a 43 y.o. male.   43 year old male who presents urgent care with complaints of nasal congestion and sore throat.  This started last night.  He was concerned as he does have a child who was diagnosed with strep throat recently and he coaches soccer team.  He would like to have strep testing done today.  He denies any fevers, nausea, vomiting, cough.   Sore Throat Pertinent negatives include no chest pain, no abdominal pain and no shortness of breath.    Past Medical History:  Diagnosis Date   ADD (attention deficit disorder)    GERD (gastroesophageal reflux disease)     Patient Active Problem List   Diagnosis Date Noted   Acid reflux 05/04/2019   Attention deficit hyperactivity disorder (ADHD) 05/04/2019    History reviewed. No pertinent surgical history.     Home Medications    Prior to Admission medications   Medication Sig Start Date End Date Taking? Authorizing Provider  amphetamine -dextroamphetamine  (ADDERALL XR) 30 MG 24 hr capsule Take 1 capsule (30 mg total) by mouth daily. 04/19/24   Mozingo, Regina Nattalie, NP  amphetamine -dextroamphetamine  (ADDERALL XR) 30 MG 24 hr capsule Take 1 capsule (30 mg total) by mouth daily. 05/17/24   Mozingo, Regina Nattalie, NP  amphetamine -dextroamphetamine  (ADDERALL XR) 30 MG 24 hr capsule Take 1 capsule (30 mg total) by mouth daily. 06/14/24   Mozingo, Regina Nattalie, NP  amphetamine -dextroamphetamine  (ADDERALL) 20 MG tablet Take 1 tablet (20 mg total) by mouth daily. 04/19/24   Mozingo, Regina Nattalie, NP  cetirizine (ZYRTEC) 10 MG tablet Take 10 mg by mouth daily.    [provider]  naproxen  (NAPROSYN ) 500 MG tablet Take 1 tablet (500 mg total) by mouth 2 (two) times daily with a meal. 04/20/23   Nafziger, Darleene, NP   pantoprazole  (PROTONIX ) 40 MG tablet Take 1 tablet (40 mg total) by mouth daily. 01/22/21   Nafziger, Darleene, NP  traMADol  (ULTRAM ) 50 MG tablet Take 1 tablet (50 mg total) by mouth at bedtime as needed. 04/29/23   Nafziger, Darleene, NP    Family History Family History  Problem Relation Age of Onset   Arthritis Maternal Grandmother    Hypertension Maternal Grandfather    Arthritis Maternal Grandfather    Colon cancer Neg Hx    Esophageal cancer Neg Hx    Rectal cancer Neg Hx    Stomach cancer Neg Hx     Social History Social History   Tobacco Use   Smoking status: Former    Types: Cigarettes   Smokeless tobacco: Never  Vaping Use   Vaping status: Never Used  Substance Use Topics   Alcohol use: Not Currently    Comment: occ   Drug use: Yes    Types: Marijuana     Allergies   Patient has no known allergies.   Review of Systems Review of Systems  Constitutional:  Negative for chills and fever.  HENT:  Positive for congestion and sore throat. Negative for ear pain.   Eyes:  Negative for pain and visual disturbance.  Respiratory:  Negative for cough and shortness of breath.   Cardiovascular:  Negative for chest pain and palpitations.  Gastrointestinal:  Negative for abdominal pain and vomiting.  Genitourinary:  Negative for dysuria and hematuria.  Musculoskeletal:  Negative for arthralgias and back pain.  Skin:  Negative for color change and rash.  Neurological:  Negative for seizures and syncope.  All other systems reviewed and are negative.    Physical Exam Triage Vital Signs ED Triage Vitals  Encounter Vitals Group     BP 05/27/24 0924 126/86     Girls Systolic BP Percentile --      Girls Diastolic BP Percentile --      Boys Systolic BP Percentile --      Boys Diastolic BP Percentile --      Pulse --      Resp 05/27/24 0924 16     Temp 05/27/24 0924 97.9 F (36.6 C)     Temp Source 05/27/24 0924 Oral     SpO2 05/27/24 0924 95 %     Weight --      Height --       Head Circumference --      Peak Flow --      Pain Score 05/27/24 0922 2     Pain Loc --      Pain Education --      Exclude from Growth Chart --    No data found.  Updated Vital Signs BP 126/86 (BP Location: Right Arm)   Temp 97.9 F (36.6 C) (Oral)   Resp 16   SpO2 95%   Visual Acuity Right Eye Distance:   Left Eye Distance:   Bilateral Distance:    Right Eye Near:   Left Eye Near:    Bilateral Near:     Physical Exam Vitals and nursing note reviewed.  Constitutional:      General: He is not in acute distress.    Appearance: He is well-developed.  HENT:     Head: Normocephalic and atraumatic.     Right Ear: Tympanic membrane normal.     Left Ear: There is impacted cerumen.     Nose: Congestion and rhinorrhea present. Rhinorrhea is clear.     Mouth/Throat:     Mouth: Mucous membranes are moist.     Pharynx: Posterior oropharyngeal erythema (Very mild) and postnasal drip present. No pharyngeal swelling or oropharyngeal exudate.  Eyes:     Conjunctiva/sclera: Conjunctivae normal.  Cardiovascular:     Rate and Rhythm: Normal rate and regular rhythm.     Heart sounds: No murmur heard. Pulmonary:     Effort: Pulmonary effort is normal. No respiratory distress.     Breath sounds: Normal breath sounds.  Abdominal:     Palpations: Abdomen is soft.     Tenderness: There is no abdominal tenderness.  Musculoskeletal:        General: No swelling.     Cervical back: Neck supple.  Skin:    General: Skin is warm and dry.     Capillary Refill: Capillary refill takes less than 2 seconds.  Neurological:     Mental Status: He is alert.  Psychiatric:        Mood and Affect: Mood normal.      UC Treatments / Results  Labs (all labs ordered are listed, but only abnormal results are displayed) Labs Reviewed  POCT RAPID STREP A (OFFICE)    EKG   Radiology No results found.  Procedures Procedures (including critical care time)  Medications Ordered in  UC Medications - No data to display  Initial Impression / Assessment and Plan / UC Course  I have reviewed the triage  vital signs and the nursing notes.  Pertinent labs & imaging results that were available during my care of the patient were reviewed by me and considered in my medical decision making (see chart for details).     Sore throat - Plan: POCT rapid strep A, POCT rapid strep A  Nasal congestion  Impacted cerumen, left ear   Strep testing done today and was negative.  There is postnasal drip present and likely this is contributing to the sore throat.  For now monitor the symptoms and can use over-the-counter nasal decongestion.  On physical exam we did note that the left ear was completely impacted with cerumen (earwax).  Left ear irrigation done today. Return to urgent care or PCP if symptoms worsen or fail to resolve.    Final Clinical Impressions(s) / UC Diagnoses   Final diagnoses:  Sore throat  Nasal congestion  Impacted cerumen, left ear     Discharge Instructions      Strep testing done today and was negative.  There is postnasal drip present and likely this is contributing to the sore throat.  For now monitor the symptoms and can use over-the-counter nasal decongestion.  On physical exam we did note that the left ear was completely impacted with cerumen (earwax).  Left ear irrigation done today. Return to urgent care or PCP if symptoms worsen or fail to resolve.       ED Prescriptions   None    PDMP not reviewed this encounter.   Teresa Almarie LABOR, PA-C 05/27/24 [phone]

## 2024-05-27 NOTE — ED Triage Notes (Signed)
 Pt c/o nasal congestion and sore throat started last night-denies fever-no meds PTA-reports +strep exposure-NAD-steady gait

## 2024-07-19 ENCOUNTER — Encounter: Payer: Self-pay | Admitting: Adult Health

## 2024-07-19 ENCOUNTER — Telehealth: Admitting: Adult Health

## 2024-07-19 DIAGNOSIS — F909 Attention-deficit hyperactivity disorder, unspecified type: Secondary | ICD-10-CM | POA: Diagnosis not present

## 2024-07-19 MED ORDER — AMPHETAMINE-DEXTROAMPHET ER 30 MG PO CP24: 30.0000 mg | ORAL_CAPSULE | Freq: Every day | ORAL | 0 refills | Status: AC

## 2024-07-19 MED ORDER — AMPHETAMINE-DEXTROAMPHETAMINE 20 MG PO TABS: 20.0000 mg | ORAL_TABLET | Freq: Every day | ORAL | 0 refills | Status: AC

## 2024-07-19 NOTE — Progress Notes (Signed)
 Riley Cardenas [id number] [birth year] 43 y.o.  Virtual Visit via Video Note  I connected with pt @ on 07/19/24 at  8:00 AM EST by a video enabled telemedicine application and verified that I am speaking with the correct person using two identifiers.   I discussed the limitations of evaluation and management by telemedicine and the availability of in person appointments. The patient expressed understanding and agreed to proceed.  I discussed the assessment and treatment plan with the patient. The patient was provided an opportunity to ask questions and all were answered. The patient agreed with the plan and demonstrated an understanding of the instructions.   The patient was advised to call back or seek an in-person evaluation if the symptoms worsen or if the condition fails to improve as anticipated.  I provided 10 minutes of non-face-to-face time during this encounter.  The patient was located at home.  The provider was located at [facility].   Angeline LOISE Sayers, NP   Subjective:   Patient ID:  Riley Cardenas is a 43 y.o. (DOB Aug 02, 1981) male.  Chief Complaint: No chief complaint on file.   HPI Riley Cardenas presents for follow-up of ADHD.  Describes mood today as ok. Pleasant. Denies tearfulness. Mood symptoms - denies depression, anxiety and irritability. Reports stable interest and motivation. Denies panic attacks. Denies worry, rumination and over thinking. Reporting some situational stressors - Aunt. Reports mood is stable. Stating I feel like I'm doing alright. Feels like medications continue to work well. Taking medications as prescribed.  Energy levels stable. Active, does not have a regular exercise routine. Enjoys some usual interests and activities. Married. Lives with wife and his sons. Spending time with family.  Appetite adequate. Weight gain. Reports sleep is consistent. Averages 7 hours. Focus and concentration stable. Completing tasks. Managing aspects of  household. Works full-time as a runner, broadcasting/film/video. Denies SI or HI.  Denies AH or VH. Denies self harm. Denies substance use.   Review of Systems:  Review of Systems  Musculoskeletal:  Negative for gait problem.  Neurological:  Negative for tremors.  Psychiatric/Behavioral:         Please refer to HPI    Medications: I have reviewed the patient's current medications.  Current Outpatient Medications  Medication Sig Dispense Refill   amphetamine -dextroamphetamine  (ADDERALL XR) 30 MG 24 hr capsule Take 1 capsule (30 mg total) by mouth daily. 30 capsule 0   amphetamine -dextroamphetamine  (ADDERALL XR) 30 MG 24 hr capsule Take 1 capsule (30 mg total) by mouth daily. 30 capsule 0   amphetamine -dextroamphetamine  (ADDERALL XR) 30 MG 24 hr capsule Take 1 capsule (30 mg total) by mouth daily. 30 capsule 0   amphetamine -dextroamphetamine  (ADDERALL) 20 MG tablet Take 1 tablet (20 mg total) by mouth daily. 30 tablet 0   cetirizine (ZYRTEC) 10 MG tablet Take 10 mg by mouth daily.     naproxen  (NAPROSYN ) 500 MG tablet Take 1 tablet (500 mg total) by mouth 2 (two) times daily with a meal. 30 tablet 1   pantoprazole  (PROTONIX ) 40 MG tablet Take 1 tablet (40 mg total) by mouth daily. 90 tablet 3   traMADol  (ULTRAM ) 50 MG tablet Take 1 tablet (50 mg total) by mouth at bedtime as needed. 15 tablet 0   No current facility-administered medications for this visit.    Medication Side Effects: None  Allergies: No Known Allergies  Past Medical History:  Diagnosis Date   ADD (attention deficit disorder)    GERD (gastroesophageal reflux disease)  Family History  Problem Relation Age of Onset   Arthritis Maternal Grandmother    Hypertension Maternal Grandfather    Arthritis Maternal Grandfather    Colon cancer Neg Hx    Esophageal cancer Neg Hx    Rectal cancer Neg Hx    Stomach cancer Neg Hx     Social History   Socioeconomic History   Marital status: Married    Spouse name: Not on file   Number  of children: Not on file   Years of education: Not on file   Highest education level: Bachelor's degree (e.g., BA, AB, BS)  Occupational History   Not on file  Tobacco Use   Smoking status: Former    Types: Cigarettes   Smokeless tobacco: Never  Vaping Use   Vaping status: Never Used  Substance and Sexual Activity   Alcohol use: Not Currently    Comment: occ   Drug use: Yes    Types: Marijuana   Sexual activity: Yes    Partners: Female  Other Topics Concern   Not on file  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    Married    48 month old son    Social Drivers of Corporate Investment Banker Strain: Low Risk  (03/23/2023)   Overall Financial Resource Strain (CARDIA)    Difficulty of Paying Living Expenses: Not very hard  Food Insecurity: No Food Insecurity (03/23/2023)   Hunger Vital Sign    Worried About Running Out of Food in the Last Year: Never true    Ran Out of Food in the Last Year: Never true  Transportation Needs: No Transportation Needs (03/23/2023)   PRAPARE - Administrator, Civil Service (Medical): No    Lack of Transportation (Non-Medical): No  Physical Activity: Unknown (03/23/2023)   Exercise Vital Sign    Days of Exercise per Week: 0 days    Minutes of Exercise per Session: Not on file  Stress: No Stress Concern Present (03/23/2023)   [employer] of Occupational Health - Occupational Stress Questionnaire    Feeling of Stress : Only a little  Social Connections: Moderately Integrated (03/23/2023)   Social Connection and Isolation Panel    Frequency of Communication with Friends and Family: Once a week    Frequency of Social Gatherings with Friends and Family: Once a week    Attends Religious Services: More than 4 times per year    Active Member of [employer] or Organizations: Yes    Attends Engineer, Structural: More than 4 times per year    Marital Status: Married  Catering Manager Violence: Not on file     Past Medical History, Surgical history, Social history, and Family history were reviewed and updated as appropriate.   Please see review of systems for further details on the patient's review from today.   Objective:   Physical Exam:  There were no vitals taken for this visit.  Physical Exam Constitutional:      General: He is not in acute distress. Musculoskeletal:        General: No deformity.  Neurological:     Mental Status: He is alert and oriented to person, place, and time.     Coordination: Coordination normal.  Psychiatric:        Attention and Perception: Attention and perception normal. He does not perceive auditory or visual hallucinations.        Mood and Affect: Mood normal.  Mood is not anxious or depressed. Affect is not labile, blunt, angry or inappropriate.        Speech: Speech normal.        Behavior: Behavior normal.        Thought Content: Thought content normal. Thought content is not paranoid or delusional. Thought content does not include homicidal or suicidal ideation. Thought content does not include homicidal or suicidal plan.        Cognition and Memory: Cognition and memory normal.        Judgment: Judgment normal.     Comments: Insight intact     Lab Review:     Component Value Date/Time   NA 140 04/29/2022 0829   K 4.1 04/29/2022 0829   CL 105 04/29/2022 0829   CO2 25 04/29/2022 0829   GLUCOSE 86 04/29/2022 0829   BUN 12 04/29/2022 0829   CREATININE 0.96 04/29/2022 0829   CALCIUM 9.2 04/29/2022 0829   PROT 7.0 04/29/2022 0829   ALBUMIN 4.1 04/29/2022 0829   AST 18 04/29/2022 0829   ALT 11 04/29/2022 0829   ALKPHOS 56 04/29/2022 0829   BILITOT 0.7 04/29/2022 0829       Component Value Date/Time   WBC 4.4 04/29/2022 0829   RBC 4.86 04/29/2022 0829   HGB 13.9 04/29/2022 0829   HCT 41.4 04/29/2022 0829   PLT 198.0 04/29/2022 0829   MCV 85.2 04/29/2022 0829   MCHC 33.5 04/29/2022 0829   RDW 14.7 04/29/2022 0829   LYMPHSABS 1.7  04/29/2022 0829   MONOABS 0.3 04/29/2022 0829   EOSABS 0.3 04/29/2022 0829   BASOSABS 0.1 04/29/2022 0829    No results found for: POCLITH, LITHIUM   No results found for: PHENYTOIN, PHENOBARB, VALPROATE, CBMZ   .res Assessment: Plan:    Plan:  1. Adderall XR 30mg  once daily 2. Adderall 20mg  daily - 1 script sent  Monitor BP between visits while taking stimulant medication.   RTC 3 months  10 minutes spent dedicated to the care of this patient on the date of this encounter to include pre-visit review of records, ordering of medication, post visit documentation, and face-to-face time with the patient discussing ADD. Discussed continuing current medication regimen.  Patient advised to contact office with any questions, adverse effects, or acute worsening in signs and symptoms.  Discussed potential benefits, risks, and side effects of stimulants with patient to include increased heart rate, palpitations, insomnia, increased anxiety, increased irritability, or decreased appetite.  Instructed patient to contact office if experiencing any significant tolerability issues.  There are no diagnoses linked to this encounter.   Please see After Visit Summary for patient specific instructions.  Future Appointments  Date Time Provider Department Center  07/19/2024  8:00 AM Ivis Henneman Nattalie, NP [facility] None    No orders of the defined types were placed in this encounter.     -------------------------------

## 2024-08-09 ENCOUNTER — Encounter: Payer: Self-pay | Admitting: Adult Health

## 2024-08-09 DIAGNOSIS — K2100 Gastro-esophageal reflux disease with esophagitis, without bleeding: Secondary | ICD-10-CM

## 2024-08-15 MED ORDER — PANTOPRAZOLE SODIUM 40 MG PO TBEC: 40.0000 mg | DELAYED_RELEASE_TABLET | Freq: Every day | ORAL | 0 refills | Status: AC

## 2024-10-18 ENCOUNTER — Telehealth: Admitting: Adult Health
